# Patient Record
Sex: Female | Born: 1935 | Race: White | Hispanic: No | Smoking: Never smoker
Health system: Southern US, Community
[De-identification: ages and names within clinical notes are randomized; demographics above are authoritative.]

## PROBLEM LIST (undated history)

## (undated) DIAGNOSIS — I10 Essential (primary) hypertension: Secondary | ICD-10-CM

## (undated) DIAGNOSIS — G44009 Cluster headache syndrome, unspecified, not intractable: Secondary | ICD-10-CM

## (undated) DIAGNOSIS — K900 Celiac disease: Secondary | ICD-10-CM

## (undated) DIAGNOSIS — R0602 Shortness of breath: Secondary | ICD-10-CM

## (undated) DIAGNOSIS — I4891 Unspecified atrial fibrillation: Secondary | ICD-10-CM

## (undated) DIAGNOSIS — T4145XA Adverse effect of unspecified anesthetic, initial encounter: Secondary | ICD-10-CM

## (undated) DIAGNOSIS — G4700 Insomnia, unspecified: Secondary | ICD-10-CM

## (undated) DIAGNOSIS — T8859XA Other complications of anesthesia, initial encounter: Secondary | ICD-10-CM

## (undated) DIAGNOSIS — D649 Anemia, unspecified: Secondary | ICD-10-CM

## (undated) DIAGNOSIS — M1610 Unilateral primary osteoarthritis, unspecified hip: Secondary | ICD-10-CM

## (undated) DIAGNOSIS — I499 Cardiac arrhythmia, unspecified: Secondary | ICD-10-CM

## (undated) DIAGNOSIS — F419 Anxiety disorder, unspecified: Secondary | ICD-10-CM

## (undated) HISTORY — DX: Cluster headache syndrome, unspecified, not intractable: G44.009

## (undated) HISTORY — PX: HIP PINNING: SHX1757

## (undated) HISTORY — DX: Essential (primary) hypertension: I10

## (undated) HISTORY — PX: TONSILLECTOMY: SUR1361

## (undated) HISTORY — PX: APPENDECTOMY: SHX54

## (undated) HISTORY — PX: ABDOMINAL HYSTERECTOMY: SHX81

## (undated) HISTORY — DX: Unspecified atrial fibrillation: I48.91

## (undated) HISTORY — DX: Unilateral primary osteoarthritis, unspecified hip: M16.10

## (undated) HISTORY — DX: Insomnia, unspecified: G47.00

## (undated) HISTORY — PX: CHOLECYSTECTOMY: SHX55

## (undated) HISTORY — DX: Celiac disease: K90.0

---

## 1999-01-20 ENCOUNTER — Encounter: Payer: Self-pay | Admitting: Family Medicine

## 1999-01-20 ENCOUNTER — Ambulatory Visit (HOSPITAL_COMMUNITY): Admission: RE | Admit: 1999-01-20 | Discharge: 1999-01-20 | Payer: Self-pay | Admitting: Family Medicine

## 1999-02-20 ENCOUNTER — Ambulatory Visit (HOSPITAL_COMMUNITY): Admission: RE | Admit: 1999-02-20 | Discharge: 1999-02-20 | Payer: Self-pay | Admitting: Family Medicine

## 1999-02-20 ENCOUNTER — Encounter: Payer: Self-pay | Admitting: Family Medicine

## 1999-03-18 ENCOUNTER — Ambulatory Visit (HOSPITAL_COMMUNITY): Admission: RE | Admit: 1999-03-18 | Discharge: 1999-03-18 | Payer: Self-pay | Admitting: Family Medicine

## 1999-03-18 ENCOUNTER — Encounter: Payer: Self-pay | Admitting: Family Medicine

## 1999-03-25 ENCOUNTER — Encounter: Payer: Self-pay | Admitting: Family Medicine

## 1999-03-25 ENCOUNTER — Ambulatory Visit (HOSPITAL_COMMUNITY): Admission: RE | Admit: 1999-03-25 | Discharge: 1999-03-25 | Payer: Self-pay | Admitting: Family Medicine

## 2001-02-10 ENCOUNTER — Other Ambulatory Visit: Admission: RE | Admit: 2001-02-10 | Discharge: 2001-02-10 | Payer: Self-pay | Admitting: Family Medicine

## 2010-11-09 ENCOUNTER — Emergency Department (HOSPITAL_COMMUNITY)
Admission: EM | Admit: 2010-11-09 | Discharge: 2010-11-09 | Payer: Self-pay | Source: Home / Self Care | Admitting: Emergency Medicine

## 2011-05-31 ENCOUNTER — Inpatient Hospital Stay (HOSPITAL_COMMUNITY)
Admission: EM | Admit: 2011-05-31 | Discharge: 2011-06-06 | DRG: 309 | Disposition: A | Discharge status: Home Health | Payer: Medicare Other | Attending: Cardiology | Admitting: Cardiology

## 2011-05-31 ENCOUNTER — Emergency Department (HOSPITAL_COMMUNITY): Payer: Medicare Other

## 2011-05-31 DIAGNOSIS — K900 Celiac disease: Secondary | ICD-10-CM | POA: Diagnosis present

## 2011-05-31 DIAGNOSIS — E739 Lactose intolerance, unspecified: Secondary | ICD-10-CM | POA: Diagnosis present

## 2011-05-31 DIAGNOSIS — E871 Hypo-osmolality and hyponatremia: Secondary | ICD-10-CM | POA: Diagnosis present

## 2011-05-31 DIAGNOSIS — Z882 Allergy status to sulfonamides status: Secondary | ICD-10-CM

## 2011-05-31 DIAGNOSIS — Z7901 Long term (current) use of anticoagulants: Secondary | ICD-10-CM

## 2011-05-31 DIAGNOSIS — I4891 Unspecified atrial fibrillation: Principal | ICD-10-CM | POA: Diagnosis present

## 2011-05-31 DIAGNOSIS — I119 Hypertensive heart disease without heart failure: Secondary | ICD-10-CM | POA: Diagnosis present

## 2011-05-31 LAB — BASIC METABOLIC PANEL
BUN: 12 mg/dL (ref 6–23)
Chloride: 100 mEq/L (ref 96–112)
Glucose, Bld: 95 mg/dL (ref 70–99)
Potassium: 4 mEq/L (ref 3.5–5.1)
Sodium: 135 mEq/L (ref 135–145)

## 2011-05-31 LAB — DIFFERENTIAL
Basophils Absolute: 0 10*3/uL (ref 0.0–0.1)
Basophils Relative: 0 % (ref 0–1)
Eosinophils Absolute: 0.1 10*3/uL (ref 0.0–0.7)
Eosinophils Relative: 1 % (ref 0–5)
Lymphocytes Relative: 28 % (ref 12–46)
Lymphs Abs: 2.7 10*3/uL (ref 0.7–4.0)
Monocytes Absolute: 1 10*3/uL (ref 0.1–1.0)
Monocytes Relative: 10 % (ref 3–12)
Neutro Abs: 5.8 10*3/uL (ref 1.7–7.7)
Neutrophils Relative %: 61 % (ref 43–77)

## 2011-05-31 LAB — CBC
HCT: 37.2 % (ref 36.0–46.0)
Hemoglobin: 12.9 g/dL (ref 12.0–15.0)
MCH: 30 pg (ref 26.0–34.0)
MCHC: 34.7 g/dL (ref 30.0–36.0)
MCV: 86.5 fL (ref 78.0–100.0)
Platelets: 264 10*3/uL (ref 150–400)
RBC: 4.3 MIL/uL (ref 3.87–5.11)
RDW: 12.6 % (ref 11.5–15.5)
WBC: 9.7 10*3/uL (ref 4.0–10.5)

## 2011-06-01 LAB — CBC
HCT: 34.5 % — ABNORMAL LOW (ref 36.0–46.0)
Hemoglobin: 11.7 g/dL — ABNORMAL LOW (ref 12.0–15.0)
MCHC: 33.9 g/dL (ref 30.0–36.0)
RDW: 12.7 % (ref 11.5–15.5)
WBC: 7.2 10*3/uL (ref 4.0–10.5)

## 2011-06-01 LAB — PRO B NATRIURETIC PEPTIDE: Pro B Natriuretic peptide (BNP): 684.4 pg/mL — ABNORMAL HIGH (ref 0–450)

## 2011-06-01 LAB — HEPARIN LEVEL (UNFRACTIONATED)
Heparin Unfractionated: <0.1 IU/mL — ABNORMAL LOW (ref 0.30–0.70)
Heparin Unfractionated: 0.98 IU/mL — ABNORMAL HIGH (ref 0.30–0.70)

## 2011-06-01 LAB — MRSA PCR SCREENING: MRSA by PCR: NEGATIVE

## 2011-06-01 LAB — D-DIMER, QUANTITATIVE: D-Dimer, Quant: 0.42 ug/mL-FEU (ref 0.00–0.48)

## 2011-06-01 LAB — TROPONIN I: Troponin I: <0.3 ng/mL (ref <0.30)

## 2011-06-02 LAB — CBC
Hemoglobin: 12.6 g/dL (ref 12.0–15.0)
MCH: 30.3 pg (ref 26.0–34.0)
MCHC: 35.2 g/dL (ref 30.0–36.0)
MCV: 86.1 fL (ref 78.0–100.0)
RBC: 4.16 MIL/uL (ref 3.87–5.11)

## 2011-06-02 LAB — HEPARIN LEVEL (UNFRACTIONATED): Heparin Unfractionated: 0.21 IU/mL — ABNORMAL LOW (ref 0.30–0.70)

## 2011-06-03 LAB — BASIC METABOLIC PANEL
BUN: 8 mg/dL (ref 6–23)
CO2: 32 mEq/L (ref 19–32)
Calcium: 8.7 mg/dL (ref 8.4–10.5)
Chloride: 91 mEq/L — ABNORMAL LOW (ref 96–112)
Creatinine, Ser: <0.47 mg/dL — ABNORMAL LOW (ref 0.50–1.10)
Glucose, Bld: 109 mg/dL — ABNORMAL HIGH (ref 70–99)
Potassium: 3.6 mEq/L (ref 3.5–5.1)
Sodium: 130 mEq/L — ABNORMAL LOW (ref 135–145)

## 2011-06-03 LAB — PROTIME-INR
INR: 1.39 (ref 0.00–1.49)
Prothrombin Time: 17.3 seconds — ABNORMAL HIGH (ref 11.6–15.2)

## 2011-06-03 LAB — CBC
HCT: 35.2 % — ABNORMAL LOW (ref 36.0–46.0)
Hemoglobin: 12.5 g/dL (ref 12.0–15.0)
MCV: 83.6 fL (ref 78.0–100.0)
WBC: 9.4 10*3/uL (ref 4.0–10.5)

## 2011-06-03 LAB — HEPARIN LEVEL (UNFRACTIONATED): Heparin Unfractionated: 0.31 IU/mL (ref 0.30–0.70)

## 2011-06-04 LAB — BASIC METABOLIC PANEL
Chloride: 96 mEq/L (ref 96–112)
Creatinine, Ser: <0.47 mg/dL — ABNORMAL LOW (ref 0.50–1.10)
Sodium: 133 mEq/L — ABNORMAL LOW (ref 135–145)

## 2011-06-04 LAB — PROTIME-INR
INR: 1.9 — ABNORMAL HIGH (ref 0.00–1.49)
Prothrombin Time: 22.1 seconds — ABNORMAL HIGH (ref 11.6–15.2)

## 2011-06-04 LAB — CBC
HCT: 36.8 % (ref 36.0–46.0)
Hemoglobin: 13.1 g/dL (ref 12.0–15.0)
RBC: 4.39 MIL/uL (ref 3.87–5.11)
RDW: 12.3 % (ref 11.5–15.5)
WBC: 7.8 10*3/uL (ref 4.0–10.5)

## 2011-06-05 LAB — CBC
HCT: 37 % (ref 36.0–46.0)
Platelets: 288 10*3/uL (ref 150–400)
RBC: 4.34 MIL/uL (ref 3.87–5.11)
RDW: 12.7 % (ref 11.5–15.5)
WBC: 8.7 10*3/uL (ref 4.0–10.5)

## 2011-06-05 LAB — HEPARIN LEVEL (UNFRACTIONATED): Heparin Unfractionated: 0.37 IU/mL (ref 0.30–0.70)

## 2011-06-05 LAB — PROTIME-INR: INR: 2.03 — ABNORMAL HIGH (ref 0.00–1.49)

## 2011-06-06 LAB — PROTIME-INR: Prothrombin Time: 29.8 seconds — ABNORMAL HIGH (ref 11.6–15.2)

## 2011-06-15 NOTE — H&P (Signed)
[name], [name] NO.:  [id number]  MEDICAL RECORD NO.:  [id number]  LOCATION:  [facility]                         FACILITY:  [facility]  PHYSICIAN:  Georga Hacking, M.D.DATE OF BIRTH:  [birth year]  DATE OF ADMISSION:  05/31/2011                             HISTORY & PHYSICAL   REASON FOR ADMISSION:  Shortness of breath and atrial fibrillation.  HISTORY:  The patient is a 77 year old female who has previously been in excellent health except for hypertension.  She has developed worsening hypertension following a cataract extraction and eventually saw Dr. Donato Schultz who evidently released her several months ago from his care stating that her blood pressure was better.  She does state a several months history of fatigue, some shortness of breath, and diminished energy as well as difficulty doing her normal activities.  She evidently developed hyponatremia and was taken off potentially either Lasix or lisinopril although she is unclear which one.  She eventually was tapered off trazodone recently.  She had developed worsening shortness of breath and dyspnea and went and spoke with the insurance nurse hotline. Her insurance company called her primary care doctor's office who advised to come to the emergency room where she was found to be in rapid atrial fibrillation.  She has noted some episodic atrial fibrillation and some episodic palpitations in the past.  She has complained of some vague pressure in her chest.  She denies any previous history of arrhythmias, although there is an EKG from 1999 in the medical record system that showed this rapid atrial fibrillation.  She has never been on anticoagulation.  She was given intravenous diltiazem 5 mg IV and is admitted to the hospital for further evaluation.  PAST MEDICAL HISTORY:  Remarkable for hypertension.  There is no history of diabetes or hyperlipidemia.  There is no history of GI bleeding.  She does have a  history of celiac disease and is also lactose intolerant. There is no history of stroke or TIAs.  PAST SURGICAL HISTORY: 1. Cholecystectomy. 2. Tonsillectomy. 3. Appendectomy. 4. Left hip fracture. 5. Hysterectomy.  ALLERGIES:  CODEINE.  CURRENT MEDICATIONS: 1. Metoprolol 25 mg daily. 2. Clonidine 0.05 mg t.i.d.  FAMILY HISTORY:  Father died at age 51 of old age.  Mother died age 31 of old age.  She is the sole survivor of her family.  She had seven brothers and sisters.  One sister died at age 11 of heart disease.  All the other brothers and sisters died in their 38s and 25s.  She has seven children, one child died of liver cancer, two died accidently, the others are alive and well.  SOCIAL HISTORY:  She has been a widow 3 times, her previous husband died several years ago.  She is a nonsmoker.  She does not use alcohol to excess.  She still lives independently and still drives and maintains her own home.  REVIEW OF SYSTEMS:  Weight has been stable.  No skin problems.  No eye, ear, nose, or throat problems.  She has previous cataract extraction. She has noted worsening dyspnea recently over the past 2-3 months but previous to that was able to do normal activities of  daily living.  No history of asthma.  No cough.  She complains of occasional hemorrhoids. No previous GI bleeding or ulcers normally.  Normally GI problems as long she minds her diet.  She has a history of a bladder prolapse. Otherwise, no GU symptoms or problems.  Has a previous hip surgery and mild arthritis there.  No history of stroke or TIA.  Other than as noted above, the remainder of the review of systems is unremarkable.  PHYSICAL EXAMINATION:  GENERAL:  She is a pleasant elderly woman who appears markedly younger than her stated age. VITAL SIGNS:  Blood pressure is currently 180/90, pulse is currently 110 and irregularly irregular. SKIN:  Warm and dry.  No obvious signs of trauma, mass, or  lesions. ENT:  EOMI.  PERRLA.  CNS clear.  Fundi not examined.  Pharynx is negative. NECK:  Supple without masses, JVD, thyromegaly, or bruits. LUNGS:  There is an increased AP diameter and some dorsal kyphosis noted.  Breath sounds were normal. CARDIOVASCULAR:  Rapid irregular rhythm.  There is no S3 and no murmurs appreciated. ABDOMEN:  Soft and nontender.  There is aneurysm palpated.  No hepatosplenomegaly is noted. EXTREMITIES:  Femoral pulses are 2+ without bruits.  Peripheral pulses show 3+ posterior tibialis and 1+ dorsalis pedis.  There is no edema noted. NEUROLOGIC:  Normal cranial nerves.  Sensory and motor are intact.  IMAGING:  EKG shows atrial fibrillation with rapid ventricular response, right axis deviation.  Chest x-ray shows cardiomegaly with small bilateral pleural effusions.  BMP is normal.  CBC is normal.  IMPRESSION: 1. Atrial fibrillation, paroxysmal by records, but new onset by the     patient's history, probably subacute by history. 2. Hypertensive heart disease, blood pressure not well-controlled. 3. Recent history of hyponatremia. 4. History of celiac disease. 5. Cardiomegaly.  RECOMMENDATIONS:  The patient will be admitted to Step-Down Unit and placed on IV heparin.  She has a CHADS score of 2 and likely will need anticoagulation with warfarin.  Obtain echocardiogram, TSH, and further workup per Dr. Anne Fu.     Georga Hacking, M.D.     WST/MEDQ  D:  05/31/2011  T:  05/31/2011  Job:  [id number]  cc:   [name], M.D. [name], MD  Electronically Signed by Lacretia Nicks. Donnie Aho M.D. on 06/15/2011 05:12:48 PM

## 2011-06-15 NOTE — Discharge Summary (Signed)
[name], COPPOCK NO.:  [id number]  MEDICAL RECORD NO.:  [id number]  LOCATION:  2001                         FACILITY:  [facility]  PHYSICIAN:  Georga Hacking, M.D.DATE OF BIRTH:  1917/06/21  DATE OF ADMISSION:  05/31/2011 DATE OF DISCHARGE:  06/05/2011                              DISCHARGE SUMMARY   FINAL DIAGNOSES: 1. Atrial fibrillation with rapid ventricular response, now     controlled. 2. Hypertensive heart disease. 3. History of celiac disease and lactose intolerance.  PROCEDURES:  Echocardiogram.  HISTORY:  This is a 77 year old female previous in excellent health except for hypertension.  She developed worsening hypertension following a cataract extraction, saw Dr. Donato Schultz who released her from followup several years ago, stating her blood pressure was better.  She noted a several-month history of fatigue, shortness of breath, and diminished energy and difficulty doing her normal activities.  She was tapered off trazodone recently.  She developed worsening shortness of breath and dyspnea.  Spoke to [employer] nurse, who advised her to go to the emergency room where she was found to be in rapid atrial fibrillation.  She has had episodic atrial fibrillation and palpitations in the past and there is an old EKG in 1999 showing rapid atrial fibrillation.  She has never been on warfarin anticoagulation.  She was admitted to the hospital for further evaluation.  Please see the previously dictated history and physical for remainder of the details.  HOSPITAL COURSE:  She was placed on a diltiazem drip and a heparin drip. She had some mild crackles at the bases.  Laboratory data on admission showed normal CBC.  Chem-7 was normal.  BUN was 12, creatinine was 0.47. Troponin was negative on admission.  TSH was 1.189.  She was placed on heparin and tolerated this well.  She was placed on warfarin therapy. Her atrial fibrillation came under better  control and she had a higher dose of metoprolol given.  She continued to have rapid atrial fibrillation with activity and was placed on diltiazem 120 mg twice daily that improved.  Her warfarin became therapeutic and she was able to be weaned off heparin and accordingly was discharged.  She was given extensive discharge education regarding warfarin and her atrial fibrillation rate was controlled.  Her blood pressure was still mildly elevated on discharge.  She may be a candidate for spironolactone at the time of discharge.  She is discharged at this time on clonidine 0.1 mg b.i.d., diltiazem sustained release 120 mg b.i.d., losartan 50 mg daily, metoprolol succinate 100 mg daily, warfarin she is to take 2 mg tomorrow.  Her INR is 2.78 on discharge and she is to follow up with [facility] on Tuesday for further instructions.  Calcium carbonate 1200 mg daily, iron 325 mg daily, vitamin B12 daily, and vitamin D3 daily.  She is to have home health R.N. and home health PT.  She is to follow up with Dr. Anne Fu in 1 week and is to report shortness of breath or syncope.  She is to have a warfarin anticoagulation followup on Tuesday.  Greater than 45 minutes spent in discharge preparation.     Georga Hacking,  M.D.     WST/MEDQ  D:  06/06/2011  T:  06/06/2011  Job:  [id number]  cc:   [name], MD [name], M.D.  Electronically Signed by Lacretia Nicks. Donnie Aho M.D. on 06/15/2011 05:12:54 PM

## 2011-09-20 ENCOUNTER — Emergency Department (HOSPITAL_COMMUNITY): Payer: Medicare Other

## 2011-09-20 ENCOUNTER — Inpatient Hospital Stay (HOSPITAL_COMMUNITY)
Admission: EM | Admit: 2011-09-20 | Discharge: 2011-09-23 | DRG: 291 | Disposition: A | Discharge status: Home Health | Payer: Medicare Other | Attending: Internal Medicine | Admitting: Internal Medicine

## 2011-09-20 DIAGNOSIS — I10 Essential (primary) hypertension: Secondary | ICD-10-CM | POA: Diagnosis present

## 2011-09-20 DIAGNOSIS — I4891 Unspecified atrial fibrillation: Secondary | ICD-10-CM | POA: Diagnosis present

## 2011-09-20 DIAGNOSIS — Z8719 Personal history of other diseases of the digestive system: Secondary | ICD-10-CM

## 2011-09-20 DIAGNOSIS — I509 Heart failure, unspecified: Secondary | ICD-10-CM | POA: Diagnosis present

## 2011-09-20 DIAGNOSIS — I5031 Acute diastolic (congestive) heart failure: Principal | ICD-10-CM | POA: Diagnosis present

## 2011-09-20 DIAGNOSIS — Z886 Allergy status to analgesic agent status: Secondary | ICD-10-CM

## 2011-09-20 DIAGNOSIS — Z79899 Other long term (current) drug therapy: Secondary | ICD-10-CM

## 2011-09-20 DIAGNOSIS — Z7901 Long term (current) use of anticoagulants: Secondary | ICD-10-CM

## 2011-09-20 DIAGNOSIS — J189 Pneumonia, unspecified organism: Secondary | ICD-10-CM | POA: Diagnosis present

## 2011-09-20 DIAGNOSIS — Z882 Allergy status to sulfonamides status: Secondary | ICD-10-CM

## 2011-09-20 LAB — CBC
HCT: 40.1 % (ref 36.0–46.0)
Hemoglobin: 13.4 g/dL (ref 12.0–15.0)
MCH: 28.7 pg (ref 26.0–34.0)
MCHC: 33.4 g/dL (ref 30.0–36.0)
MCV: 85.9 fL (ref 78.0–100.0)

## 2011-09-20 LAB — CK TOTAL AND CKMB (NOT AT ARMC)
CK, MB: 2.8 ng/mL (ref 0.3–4.0)
Relative Index: INVALID (ref 0.0–2.5)

## 2011-09-20 LAB — POCT I-STAT, CHEM 8
BUN: 3 mg/dL — ABNORMAL LOW (ref 6–23)
Calcium, Ion: 1.14 mmol/L (ref 1.12–1.32)
Chloride: 101 mEq/L (ref 96–112)
Creatinine, Ser: 0.4 mg/dL — ABNORMAL LOW (ref 0.50–1.10)
Glucose, Bld: 122 mg/dL — ABNORMAL HIGH (ref 70–99)
HCT: 42 % (ref 36.0–46.0)

## 2011-09-20 LAB — DIFFERENTIAL
Lymphocytes Relative: 17 % (ref 12–46)
Lymphs Abs: 1.5 10*3/uL (ref 0.7–4.0)
Monocytes Absolute: 0.6 10*3/uL (ref 0.1–1.0)
Monocytes Relative: 7 % (ref 3–12)
Neutro Abs: 6.6 10*3/uL (ref 1.7–7.7)

## 2011-09-20 LAB — D-DIMER, QUANTITATIVE: D-Dimer, Quant: 0.35 ug/mL-FEU (ref 0.00–0.48)

## 2011-09-20 LAB — URINALYSIS, ROUTINE W REFLEX MICROSCOPIC
Glucose, UA: NEGATIVE mg/dL
Hgb urine dipstick: NEGATIVE
Ketones, ur: NEGATIVE mg/dL
Protein, ur: 30 mg/dL — AB
pH: 7.5 (ref 5.0–8.0)

## 2011-09-20 LAB — CARDIAC PANEL(CRET KIN+CKTOT+MB+TROPI)
CK, MB: 2.9 ng/mL (ref 0.3–4.0)
Relative Index: INVALID (ref 0.0–2.5)
Total CK: 65 U/L (ref 7–177)
Troponin I: <0.3 ng/mL (ref <0.30)
Troponin I: <0.3 ng/mL (ref <0.30)

## 2011-09-20 LAB — URINE MICROSCOPIC-ADD ON

## 2011-09-21 ENCOUNTER — Inpatient Hospital Stay (HOSPITAL_COMMUNITY): Payer: Medicare Other

## 2011-09-21 LAB — COMPREHENSIVE METABOLIC PANEL
ALT: 27 U/L (ref 0–35)
AST: 24 U/L (ref 0–37)
Albumin: 3.2 g/dL — ABNORMAL LOW (ref 3.5–5.2)
Alkaline Phosphatase: 100 U/L (ref 39–117)
CO2: 30 mEq/L (ref 19–32)
Chloride: 102 mEq/L (ref 96–112)
Creatinine, Ser: 0.47 mg/dL — ABNORMAL LOW (ref 0.50–1.10)
GFR calc non Af Amer: 84 mL/min — ABNORMAL LOW (ref >90)
Potassium: 4.2 mEq/L (ref 3.5–5.1)
Total Bilirubin: 0.2 mg/dL — ABNORMAL LOW (ref 0.3–1.2)

## 2011-09-21 LAB — PROTIME-INR: INR: 2.71 — ABNORMAL HIGH (ref 0.00–1.49)

## 2011-09-21 LAB — CARDIAC PANEL(CRET KIN+CKTOT+MB+TROPI): Relative Index: INVALID (ref 0.0–2.5)

## 2011-09-22 LAB — BASIC METABOLIC PANEL
BUN: 9 mg/dL (ref 6–23)
CO2: 29 mEq/L (ref 19–32)
Chloride: 99 mEq/L (ref 96–112)
Glucose, Bld: 89 mg/dL (ref 70–99)
Potassium: 4.1 mEq/L (ref 3.5–5.1)
Sodium: 138 mEq/L (ref 135–145)

## 2011-09-23 LAB — PRO B NATRIURETIC PEPTIDE: Pro B Natriuretic peptide (BNP): 640.1 pg/mL — ABNORMAL HIGH (ref 0–450)

## 2011-09-23 LAB — BASIC METABOLIC PANEL
BUN: 8 mg/dL (ref 6–23)
GFR calc Af Amer: >90 mL/min (ref >90)
GFR calc non Af Amer: 84 mL/min — ABNORMAL LOW (ref >90)
Potassium: 3.8 mEq/L (ref 3.5–5.1)
Sodium: 140 mEq/L (ref 135–145)

## 2011-09-23 LAB — CBC
HCT: 36 % (ref 36.0–46.0)
Hemoglobin: 12.2 g/dL (ref 12.0–15.0)
MCHC: 33.9 g/dL (ref 30.0–36.0)
RBC: 4.23 MIL/uL (ref 3.87–5.11)
WBC: 6.7 10*3/uL (ref 4.0–10.5)

## 2011-09-23 LAB — PROTIME-INR
INR: 2.76 — ABNORMAL HIGH (ref 0.00–1.49)
Prothrombin Time: 29.6 seconds — ABNORMAL HIGH (ref 11.6–15.2)

## 2011-09-23 NOTE — Discharge Summary (Signed)
[name], MENNELLA NO.:  [id number]  MEDICAL RECORD NO.:  [id number]  LOCATION:  4734                         FACILITY:  [facility]  PHYSICIAN:  Isidor Holts, M.D.  DATE OF BIRTH:  Nov 22, 1917  DATE OF ADMISSION:  09/20/2011 DATE OF DISCHARGE:                              DISCHARGE SUMMARY   PRIMARY MD:  Marissa Joe, MD.  PRIMARY CARDIOLOGIST:  Mark C. Anne Fu, MD  DISCHARGE DIAGNOSES: 1. Community-acquired pneumonia. 2. Acute decompensation of diastolic congestive heart failure     (ejection fraction 65% to 70%). 3. Dyspnea, multifactorial secondary to community-acquired pneumonia     and acute decompensation of diastolic congestive heart failure. 4. Chronic atrial fibrillation on anticoagulation. 5. Hypertension.  DISCHARGE MEDICATIONS: 1. Furosemide 40 mg p.o. daily. 2. Avelox 400 mg p.o. daily for 7 days. 3. Metoprolol succinate XL 150 mg p.o. daily(was on 100 mg p.o.     daily). 4. Calcium carbonate/vitamin-D (1200 mg/1000 international units) 1     tablet p.o. daily. 5. Clonidine 0.1 mg p.o. b.i.d. 6. Diltiazem CD 120 mg p.o. b.i.d. 7. Iron 325 mg p.o. daily. 8. Losartan 50 mg p.o. daily. 9. Multivitamin therapeutic 1 tablet p.o. daily. 10.Vitamin B12 1000 mcg p.o. daily. 11.Coumadin per INR, currently on alternating the regimen, i.e., 1 mg     on one day and 2 mg the next ETC.  PROCEDURES: 1. Chest x-ray, September 20, 2011.  This showed small pleural effusion     with associated consolidations, atelectasis versus pneumonia.     Biapical opacities may represent pleural parenchymal scarring,     appears more prominent; however, this may be exaggerated by     portable technique. 2. Chest x-ray, September 21, 2011.  This showed improvement in bilateral     airspace disease in the upper and lower lobes.  This may be due to     resolving pneumonia or edema.  Small pleural effusions remain.  CONSULTATIONS:  Marissa Bathe, MD,  cardiologist.  ADMISSION HISTORY:  As in H and P notes of September 20, 2011, dictated by Dr. Donnalee Buck. However, in brief, this is a 77 year old female with known history of chronic atrial fibrillation on anticoagulation, hypertension, celiac disease,  lactose intolerance, status post cholecystectomy, status post tonsillectomy, status post appendectomy, status post previous left hip fracture and status post hysterectomy, presenting with progressive shortness of breath associated with pleuritic-type chest pain, 3 pillow orthopnea, paroxysmal nocturnal dyspnea and lower extremity swelling for the past 2 weeks.  She was admitted for further evaluation, investigation, and management.  CLINICAL COURSE: 1. Congestive heart failure.  The patient presented as described     above.  Chest x-ray showed findings suggestive of congestive heart     failure, although pneumonia could not be excluded.  Her BNP was     elevated at 1302. She was managed with intravenous Lasix.     Cardiology consultation was kindly provided by Dr. Donato Schultz who     was very helpful in adjusting the patient's medications and     supervising heart failure management.  Cardiac enzymes were cycled,     remained un-elevated.  The patient already has had a  2D     echocardiogram done in June 2012 during her last hospitalization     and this demonstrated an ejection fraction of 65%-70% consistent     with acute decompensation of diastolic congestive heart failure.     As of September 23, 2011, the patient was no longer short of breath     at rest or on exertion, was ambulating and felt considerably     better.  2. Community-acquired pneumonia.  This was demonstrated on imaging     studies done at the time of presentation, although the patient was     afebrile and did not have an elevated white cell count.  She was     managed initially with a combination of Rocephin and azithromycin     and by September 22, 2011, was feeling  considerably better.  She was     transitioned to oral Avelox on September 23, 2011 to complete a     further 7 days of antibiotic treatment.  3. Chronic atrial fibrillation.  The patient's heart rate was ranging     in the 100s to 110 at the time of presentation.  Dr. Anne Fu     recommended increasing the patient's dose of metoprolol.  This was     done accordingly, with adequate rate control.  4. Chronic anticoagulation.  This was supervised by clinical     pharmacologist and the patient's INR remained therapeutic     throughout hospitalization.  5. Hypertension.  This was controlled with a combination of clonidine,     Cardizem CD, metoprolol, and losartan.  DISPOSITION:  The patient as of September 23, 2011, was feeling considerably better.  There were no new issues.  She was considered clinically stable for discharge.  Her dyspnea was obviously multifactorial secondary to a combination of pneumonia and decompensation of congestive heart failure and this has resolved.  She was evaluated by PT/OT during the course of this hospitalization and recommended home health PT as well as 24-hour supervision, initially. She does have a supportive family.  The patient was discharged on September 23, 2011.  ACTIVITY:  As tolerated.  Recommended to increase activity slowly, otherwise per PT.  DIET:  Heart healthy.  FOLLOWUP INSTRUCTIONS:  The patient will follow up with her primary MD, Dr. Tally Buck in the coming week.  She is instructed to call for an appointment.  In addition, she is to follow up with Dr. Serena Buck, her primary cardiologist in 2 weeks.  She has been instructed to call for an appointment.  The patient has been instructed to present at Dr. Anne Fu' office in the a.m. of Monday September 27, 2011 for PT/INR check.  Timing of further INR checks as well as modification of Coumadin dosage if indicated, will thereafter be as directed by her primary cardiologist.  All this has  been communicated to the patient and family.  They verbalized understanding.     Isidor Holts, M.D.     CO/MEDQ  D:  09/23/2011  T:  09/23/2011  Job:  [id number]  cc:   [name], M.D. Marissa Bathe, MD  Electronically Signed by Isidor Holts M.D. on 09/23/2011 06:30:43 PM

## 2011-09-23 NOTE — Consult Note (Signed)
NAMEJESSLY, LEBECK NO.:  [id number]  MEDICAL RECORD NO.:  [id number]  LOCATION:  4734                         FACILITY:  [facility]  PHYSICIAN:  [name], MD      DATE OF BIRTH:  [birth year]  DATE OF CONSULTATION:  09/20/2011 DATE OF DISCHARGE:                                CONSULTATION   PRIMARY PHYSICIAN:  [name], MD  CARDIOLOGIST:  [name], MD  REASON FOR CONSULTATION:  Evaluation of dyspnea.  HISTORY OF PRESENT ILLNESS:  Ms. Utt is a 77 year old female with normal ejection fraction, hypertension, chronic atrial fibrillation on chronic anticoagulation with hypertension who was in her usual state until earlier this morning where she "did not think she was going to make it" due to excessive dyspnea on exertion.  She promptly came over to the [facility] Emergency Room for further evaluation where her EKG via EMS demonstrated atrial fibrillation with heart rates in the 115 range and currently in the 100 range on telemetry.  Her blood pressure upon arrival was 164/73 and she was saturating 100% on 2 L.  She did not complain of any chest discomfort, but she was acutely short of breath. No recent fevers, although she did state that she was chilled yesterday when the weather changed temperature.  She also denies any recent cough. She was admitted via the [facility].  Currently, she is just finishing up lunch and appeared to be quite comfortable in bed, although while we were talking she did increase her rate of breathing. She underwent a chest x-ray which demonstrated small pleural effusions with consolidations, atelectasis versus pneumonia.  She was placed empirically on antibiotics for this.  PAST MEDICAL HISTORY: 1. Atrial fibrillation, persistent, on chronic Coumadin. 2. Hypertensive heart disease - diastolic dysfunction. 3. History of celiac disease, lactose intolerance. 4. Hyponatremia. 5. Insomnia.  MEDICATIONS:  At home,  she is taking: 1. Losartan 50 mg once a day. 2. Diltiazem ER 120 mg twice a day. 3. Metoprolol succinate 100 mg a day. 4. Clonidine 0.1 mg twice a day. 5. Warfarin 2 mg/1 mg every other day. 6. Iron tablets. 7. Calcium tablets. 8. Vitamin B12.  SOCIAL HISTORY:  She is nonsmoker, no alcohol, no drug use.  ALLERGIES: 1. CODEINE. 2. SULFA DRUGS. 3. ASPIRIN makes her feel like she is floating.  FAMILY HISTORY:  Father had a myocardial infarction at age of 63.  Her daughter has liver cancer.  REVIEW OF SYSTEMS:  Unless explained above, all other 12 review of systems negative.  She does admit to having some pleuritic-type chest discomfort underneath her ribcage bilaterally radiating to her back when she takes in a deep breath that is mild in intensity.  PHYSICAL EXAM:  VITAL SIGNS:  Blood pressure originally 160/90, currently 142/78.  Pulse in the 80s to 110 range.  She is currently 100- 110, in atrial fibrillation.  Saturating 99% on 2 L. GENERAL:  Alert and oriented x3, in no acute distress, looks much younger than stated age, pleasant, mildly anxious. NECK:  Supple.  No lymphadenopathy.  No thyromegaly.  No carotid bruits. No JVD. CARDIOVASCULAR:  Irregularly irregular rhythm with a soft systolic murmur  heard at right upper sternal border. LUNGS:  Clear to auscultation bilaterally.  Normal respiratory effort. There are mild crackles heard at bases bilaterally.  No wheezes. ABDOMEN:  Soft, nontender, normoactive bowel sounds.  No rebound or guarding. EXTREMITIES:  No clubbing, cyanosis, or edema.  Palpable distal pulses. GU:  Deferred. RECTAL:  Deferred. SKIN:  Warm, dry, and intact. HEENT:  Normocephalic atraumatic.  Moist mucous membranes.  DATA:  Chest x-ray personally reviewed as described above.  Bilateral opacities in bases.  Recent echocardiogram from June 2012 shows normal EF, mitral annular calcification, mild MR, mild to moderate TR with pulmonary pressures  estimated at 39 mmHg.  Cardiac markers are currently normal.  Creatinine 0.4.  Hemoglobin 14.  INR currently pending.  Prior medical records from outpatient setting reviewed as well.  ASSESSMENT/PLAN:  This is a 77 year old female with persistent atrial fibrillation, diastolic dysfunction, hypertension, and chronic anticoagulation who presented with acute dyspnea. 1. Acute dyspnea - possible combination of etiologies include     bilateral pneumonia based on chest x-ray and possibly a component     of acute diastolic heart failure in the setting of     hypertension/atrial fibrillation.  Her atrial fibrillation     currently is fairly well rate controlled with heart rates of     approximately 100.  She is on Toprol-XL 100 mg a day as well as     diltiazem 120 mg twice a day.  I would like to increase her Toprol-     XL from 100-150 mg a day to see if this will help better, rate     control her.  I do not believe that she needs a repeat     echocardiogram.  It is reasonable to check a BNP.  Currently, she     is feeling much better.  I am not sure whether her acute dyspneic     episode might have been triggered by hypertension as well.     Continue to monitor her closely.  Rule out any life-threatening     etiologies.  Current cardiac markers are negative.  Current sodium     is     139 with a prior history of hyponatremia.  Coumadin is therapeutic     at 2.23. 2. Hypertension - continue current outpatient strategy including ARB. 3. Pneumonia - per Primary Team.  We will follow along with you.     [name], MD     MCS/MEDQ  D:  09/20/2011  T:  09/21/2011  Job:  [id number]  Electronically Signed by Donato Schultz MD on 09/23/2011 06:20:38 AM

## 2011-09-24 NOTE — H&P (Signed)
[name], [name]               ACCOUNT NO.:  [id number]  MEDICAL RECORD NO.:  [id number]  LOCATION:  4734                         FACILITY:  [facility]  PHYSICIAN:  Kela Millin, M.D.DATE OF BIRTH:  05-Jan-1917  DATE OF ADMISSION:  09/20/2011 DATE OF DISCHARGE:                             HISTORY & PHYSICAL   PRIMARY CARE PHYSICIAN:  [name], MD  PRIMARY CARDIOLOGIST:  [name], MD  CHIEF COMPLAINT:  Worsening shortness of breath and pleuritic chest pain.  HISTORY OF PRESENT ILLNESS:  The patient is a 77 year old white female with past medical history significant for atrial fibrillation, hypertensive heart disease, and history of celiac disease and lactose intolerance who presents with the above complaints.  She states that since she was discharged from the hospital in June of this year following hospitalization for AFib with rapid ventricular response, she has been short of breath and it has just progressively worsened to where she was short of breath last night just with talking and with very minimal exertion.  She admits to a 3 pillow orthopnea.  She also states that she has had some PND and leg swelling for the past couple of weeks greater in her right lower extremity.  She was seen in the ED and a set of cardiac enzymes were negative.  A chest x-ray was done which showed small pleural effusions with associated consolidations/atelectasis versus pneumonia.  It was noted that she does have some biapical opacities which may represent pleural parenchymal scarring.  At the time of my exam, she states that her shortness of breath has improved after she got a breathing treatment in the ED.  She admits to pleuritic chest pain - tightness in her chest and in her back only with breathing.  She denies cough, hemoptysis, dysuria, fevers, diarrhea, melena, and no hematochezia.  She states that she has had some urinary frequency but again denies dysuria.  She is  admitted for further evaluation and management.  PAST MEDICAL HISTORY:  As above.  MEDICATIONS: 1. Clonidine 0.1 mg p.o. b.i.d. 2. Losartan 50 mg p.o. daily. 3. Multivitamins 1 p.o. daily. 4. Calcium carbonate/vitamin D 1200 mg/1000 units p.o. daily. 5. Vitamin B12 1000 mcg p.o. daily. 6. Iron 325 mg p.o. daily. 7. Coumadin 2 mg 0.5-1 tablet at bedtime. 8. Diltiazem 120 mg 1 tablet b.i.d. 9. Metoprolol 100 mg XL p.o. daily.  ALLERGIES:  CODEINE and SULFA.  SOCIAL HISTORY:  Denies tobacco, also denies alcohol.  FAMILY HISTORY:  Her dad is deceased, had an MI at age 28.  Her daughter had liver cancer.  REVIEW OF SYSTEMS:  As per HPI, other review of systems negative.  PHYSICAL EXAMINATION:  GENERAL:  The patient is an elderly white female, in no apparent distress with nasal cannula oxygen on. VITAL SIGNS:  Her temperature is 97.9 with a blood pressure of 142/78, pulse of 88, respiratory rate of 20.  O2 sat 100% on 1.5 liters of oxygen. HEENT:  PERRL, EOMI, sclerae anicteric, moist mucous membranes.  No oral exudates. NECK:  Supple, no adenopathy, no thyromegaly.  No JVD and no carotid bruits appreciated. LUNGS:  Decreased breath sounds at the bases bilaterally, moderate air movement,  no crackles and no wheezes appreciated. CARDIOVASCULAR:  Irregularly irregular, normal S1, S2, rate controlled. No S3 appreciated . ABDOMEN:  Soft, bowel sounds present, nontender, nondistended no organomegaly, no masses palpable. EXTREMITIES:  She has trace to 1+ edema in her left lower extremity and no edema on the right lower extremity.  No cyanosis. NEURO:  She is alert and oriented x3.  Cranial nerves II through XII grossly intact.  Nonfocal exam.  LABORATORY DATA:  Chest x-ray as per HPI.  Also, her white cell count is 8.8 with a hemoglobin of 13.4, hematocrit of 40.1, platelet count of 271,000.  INR is 2.23.  Point-of-care markers negative x1.  Sodium is 139 with a potassium of 3.8,  chloride 101, glucose 122, BUN of 3, creatinine 0.40, and ionized calcium is 1.14.  ASSESSMENT AND PLAN: 1. Shortness of breath/dyspnea - pneumonia versus congestive heart     failure.  We will place on empiric antibiotics, obtain a brain     natriuretic peptide.  Follow and further treat as appropriate     pending studies.  We will obtain followup PA and lateral chest x-     rays in the a.m. We will also cycle cardiac enzymes and follow. 2. Atrial fibrillation - rate is controlled.  We will continue     outpatient medications.  The patient states that she has had     dyspnea since she was discharged in June, so we will consult     Cardiology for further recommendations, cycling enzymes as above.     We will continue Coumadin with INR monitoring. 3. Hypertension - continue outpatient medications.     Kela Millin, M.D.     ACV/MEDQ  D:  09/20/2011  T:  09/20/2011  Job:  [id number]  cc:   [name], M.D. [name], MD  Electronically Signed by Donnalee Curry M.D. on 09/24/2011 09:29:50 PM

## 2012-02-03 ENCOUNTER — Ambulatory Visit
Admission: RE | Admit: 2012-02-03 | Discharge: 2012-02-03 | Disposition: A | Discharge status: Home / Self Care | Payer: PRIVATE HEALTH INSURANCE | Source: Ambulatory Visit | Attending: Family Medicine | Admitting: Family Medicine

## 2012-02-03 ENCOUNTER — Other Ambulatory Visit: Payer: Self-pay | Admitting: Family Medicine

## 2012-02-03 DIAGNOSIS — M545 Low back pain: Secondary | ICD-10-CM

## 2012-02-03 DIAGNOSIS — M25559 Pain in unspecified hip: Secondary | ICD-10-CM

## 2012-07-09 ENCOUNTER — Other Ambulatory Visit: Payer: Self-pay | Admitting: Cardiology

## 2013-09-13 ENCOUNTER — Ambulatory Visit (INDEPENDENT_AMBULATORY_CARE_PROVIDER_SITE_OTHER): Payer: Medicare Other | Admitting: Pharmacist

## 2013-09-13 DIAGNOSIS — I4891 Unspecified atrial fibrillation: Secondary | ICD-10-CM | POA: Insufficient documentation

## 2013-09-13 LAB — POCT INR: INR: 3.3

## 2013-10-05 ENCOUNTER — Ambulatory Visit (INDEPENDENT_AMBULATORY_CARE_PROVIDER_SITE_OTHER): Payer: Medicare Other | Admitting: Pharmacist

## 2013-10-05 DIAGNOSIS — I4891 Unspecified atrial fibrillation: Secondary | ICD-10-CM

## 2013-10-19 ENCOUNTER — Ambulatory Visit (INDEPENDENT_AMBULATORY_CARE_PROVIDER_SITE_OTHER): Payer: Medicare Other | Admitting: Pharmacist

## 2013-10-19 DIAGNOSIS — I4891 Unspecified atrial fibrillation: Secondary | ICD-10-CM

## 2013-10-19 LAB — POCT INR: INR: 2.1

## 2013-11-09 ENCOUNTER — Ambulatory Visit (INDEPENDENT_AMBULATORY_CARE_PROVIDER_SITE_OTHER): Payer: Medicare Other | Admitting: *Deleted

## 2013-11-09 DIAGNOSIS — I4891 Unspecified atrial fibrillation: Secondary | ICD-10-CM

## 2013-11-20 ENCOUNTER — Other Ambulatory Visit: Payer: Self-pay | Admitting: Family Medicine

## 2013-11-20 ENCOUNTER — Ambulatory Visit
Admission: RE | Admit: 2013-11-20 | Discharge: 2013-11-20 | Disposition: A | Discharge status: Home / Self Care | Payer: Medicare Other | Source: Ambulatory Visit | Attending: Family Medicine | Admitting: Family Medicine

## 2013-11-20 DIAGNOSIS — M549 Dorsalgia, unspecified: Secondary | ICD-10-CM

## 2013-11-23 ENCOUNTER — Ambulatory Visit (INDEPENDENT_AMBULATORY_CARE_PROVIDER_SITE_OTHER): Payer: Medicare Other | Admitting: Pharmacist

## 2013-11-23 DIAGNOSIS — I4891 Unspecified atrial fibrillation: Secondary | ICD-10-CM

## 2013-12-04 ENCOUNTER — Ambulatory Visit (INDEPENDENT_AMBULATORY_CARE_PROVIDER_SITE_OTHER): Payer: Medicare Other | Admitting: Pharmacist

## 2013-12-04 DIAGNOSIS — I4891 Unspecified atrial fibrillation: Secondary | ICD-10-CM

## 2013-12-25 ENCOUNTER — Ambulatory Visit (INDEPENDENT_AMBULATORY_CARE_PROVIDER_SITE_OTHER): Payer: Medicare Other

## 2013-12-25 DIAGNOSIS — I4891 Unspecified atrial fibrillation: Secondary | ICD-10-CM

## 2013-12-25 LAB — POCT INR: INR: 3.3

## 2014-01-07 ENCOUNTER — Ambulatory Visit (INDEPENDENT_AMBULATORY_CARE_PROVIDER_SITE_OTHER): Payer: Medicare Other | Admitting: *Deleted

## 2014-01-07 DIAGNOSIS — I4891 Unspecified atrial fibrillation: Secondary | ICD-10-CM

## 2014-01-07 LAB — POCT INR: INR: 1.8

## 2014-01-07 MED ORDER — WARFARIN SODIUM 1 MG PO TABS: 1.0000 mg | ORAL_TABLET | ORAL | Status: DC

## 2014-01-14 ENCOUNTER — Telehealth: Payer: Self-pay | Admitting: Cardiology

## 2014-01-14 NOTE — Telephone Encounter (Signed)
Pt's daughter called states pt went to walk in clinic over the weekend and she was started on azithromycin x 5 day course.  Advised may increase INR but effects usually minimal.  Advised to have pt increase vit K intake while on abx and keep scheduled f/u appt on 01/22/14.

## 2014-01-22 ENCOUNTER — Ambulatory Visit (INDEPENDENT_AMBULATORY_CARE_PROVIDER_SITE_OTHER): Payer: Medicare Other | Admitting: *Deleted

## 2014-01-22 DIAGNOSIS — I4891 Unspecified atrial fibrillation: Secondary | ICD-10-CM

## 2014-01-22 DIAGNOSIS — Z5181 Encounter for therapeutic drug level monitoring: Secondary | ICD-10-CM

## 2014-01-22 LAB — POCT INR: INR: 1.5

## 2014-02-05 ENCOUNTER — Ambulatory Visit (INDEPENDENT_AMBULATORY_CARE_PROVIDER_SITE_OTHER): Payer: Medicare Other | Admitting: Pharmacist

## 2014-02-05 DIAGNOSIS — I4891 Unspecified atrial fibrillation: Secondary | ICD-10-CM

## 2014-02-05 DIAGNOSIS — Z5181 Encounter for therapeutic drug level monitoring: Secondary | ICD-10-CM

## 2014-02-05 LAB — POCT INR: INR: 1.5

## 2014-02-18 ENCOUNTER — Ambulatory Visit (INDEPENDENT_AMBULATORY_CARE_PROVIDER_SITE_OTHER): Payer: Medicare Other | Admitting: Pharmacist

## 2014-02-18 DIAGNOSIS — I4891 Unspecified atrial fibrillation: Secondary | ICD-10-CM

## 2014-02-18 DIAGNOSIS — Z5181 Encounter for therapeutic drug level monitoring: Secondary | ICD-10-CM

## 2014-02-18 LAB — POCT INR: INR: 1.6

## 2014-03-08 ENCOUNTER — Encounter: Payer: Self-pay | Admitting: Cardiology

## 2014-03-08 ENCOUNTER — Ambulatory Visit (INDEPENDENT_AMBULATORY_CARE_PROVIDER_SITE_OTHER): Payer: Medicare Other | Admitting: *Deleted

## 2014-03-08 ENCOUNTER — Ambulatory Visit (INDEPENDENT_AMBULATORY_CARE_PROVIDER_SITE_OTHER): Payer: Medicare Other | Admitting: Cardiology

## 2014-03-08 VITALS — BP 112/58 | HR 80 | Ht 60.0 in | Wt 96.0 lb

## 2014-03-08 DIAGNOSIS — I10 Essential (primary) hypertension: Secondary | ICD-10-CM

## 2014-03-08 DIAGNOSIS — Z7901 Long term (current) use of anticoagulants: Secondary | ICD-10-CM

## 2014-03-08 DIAGNOSIS — E43 Unspecified severe protein-calorie malnutrition: Secondary | ICD-10-CM

## 2014-03-08 DIAGNOSIS — I4891 Unspecified atrial fibrillation: Secondary | ICD-10-CM

## 2014-03-08 DIAGNOSIS — R0989 Other specified symptoms and signs involving the circulatory and respiratory systems: Secondary | ICD-10-CM

## 2014-03-08 DIAGNOSIS — Z5181 Encounter for therapeutic drug level monitoring: Secondary | ICD-10-CM

## 2014-03-08 DIAGNOSIS — R0609 Other forms of dyspnea: Secondary | ICD-10-CM

## 2014-03-08 DIAGNOSIS — R0600 Dyspnea, unspecified: Secondary | ICD-10-CM | POA: Insufficient documentation

## 2014-03-08 LAB — POCT INR: INR: 2.5

## 2014-03-08 NOTE — Patient Instructions (Signed)
Your physician has recommended you make the following change in your medication:   1. Take Clonidine for the next three days at night then stop completely.  Your physician wants you to follow-up in: 6 months with Dr. Syliva OvermanSkians. You will receive a reminder letter in the mail two months in advance. If you don't receive a letter, please call our office to schedule the follow-up appointment.

## 2014-03-08 NOTE — Progress Notes (Signed)
[address] [place], [place]  [address] Phone: [phone] Fax:  [phone]  Date:  03/08/2014   ID:  [name], DOB 05-Apr-1917, MRN [id number]  PCP:  No primary provider on file.   History of Present Illness: [name] is a 81 y.o. female with atrial fibrillation persistent with rate control, chronic anticoagulation with chronic complaint of intermittent shortness of breath. Echocardiogram with normal EF. Kyphotic/restricted. No orthopnea.  She also stated that occasionally she will feel panicky and will have increased shortness of breath off and on. No syncope. She worries. She visibly starts to hyperventilate at times. Denies any chest pain, syncope. She had a bad cold virus in February 2015.   Wt Readings from Last 3 Encounters:  03/08/14 96 lb (43.545 kg)     Past Medical History  Diagnosis Date  . HTN (hypertension)   . Arthritis, hip     , Left  . Insomnia   . Celiac disease   . Headaches, cluster   . Atrial fibrillation     No past surgical history on file.  Current Outpatient Prescriptions  Medication Sig Dispense Refill  . acetaminophen (TYLENOL) 500 MG tablet Take 500 mg by mouth every 6 (six) hours as needed.      . Calcium Carbonate-Vit D-Min (CALCIUM 1200 PO) Take 1 tablet by mouth daily.      . cloNIDine (CATAPRES) 0.1 MG tablet Take 0.1 mg by mouth 2 (two) times daily.      Marland Kitchen diltiazem (CARDIZEM CD) 120 MG 24 hr capsule Take 1 capsule by mouth 2 (two) times daily.      . furosemide (LASIX) 40 MG tablet Take 40 mg by mouth daily.      Marland Kitchen losartan (COZAAR) 50 MG tablet Take 50 mg by mouth daily.      . metoprolol succinate (TOPROL-XL) 100 MG 24 hr tablet Take 150 mg by mouth daily. Take with or immediately following a meal.      . Multiple Vitamin (MULTIVITAMIN) tablet Take 1 tablet by mouth daily.      . vitamin E 400 UNIT capsule Take 400 Units by mouth daily.      Marland Kitchen warfarin (COUMADIN) 1 MG tablet Take 1 tablet (1 mg  total) by mouth as directed.  40 tablet  3   No current facility-administered medications for this visit.    Allergies:    Allergies  Allergen Reactions  . Asa [Aspirin]     Floating  . Codeine   . Sulfa Antibiotics     Social History:  The patient  reports that she has never smoked. She does not have any smokeless tobacco history on file. She reports that she does not drink alcohol or use illicit drugs.   ROS:  Please see the history of present illness.   No bleeding with Coumadin, no strokelike symptoms, no fevers, no chills. No chest pain  PHYSICAL EXAM: VS:  BP 112/58  Pulse 80  Ht 5' (1.524 m)  Wt 96 lb (43.545 kg)  BMI 18.75 kg/m2 Thin, elderly, in no acute distress HEENT: normal Neck: no JVD Cardiac:  Irregularly irregular, normal rate, no murmur Lungs:  clear to auscultation bilaterally, no wheezing, rhonchi or rales Abd: soft, nontender, no hepatomegaly Ext: no edema Skin: warm and dry Neuro: no focal abnormalities noted  EKG:  None today    ASSESSMENT AND PLAN:  1. Atrial fibrillation-currently reasonable rate control. No changes made. 2. Hypertension-blood  pressure is fairly low. I will taper off of her clonidine. Continue to monitor blood pressure. Continue other medications. 3. Chronic anticoagulation-she remains on Coumadin. No bleeding. She is at increased risk for bleeding. Understands increased risk for stroke as well without anticoagulation. 4. Shortness of breath-intermittent, anxiety related as well. 5. Protein malnutrition-continue to encourage ensure calorie   Signed, Donato SchultzMark Sonny Poth, MD [facility]  03/08/2014 1:30 PM

## 2014-04-04 ENCOUNTER — Ambulatory Visit (INDEPENDENT_AMBULATORY_CARE_PROVIDER_SITE_OTHER): Payer: Medicare Other

## 2014-04-04 DIAGNOSIS — I4891 Unspecified atrial fibrillation: Secondary | ICD-10-CM

## 2014-04-04 DIAGNOSIS — Z5181 Encounter for therapeutic drug level monitoring: Secondary | ICD-10-CM

## 2014-04-04 LAB — POCT INR: INR: 1.8

## 2014-04-25 ENCOUNTER — Ambulatory Visit (INDEPENDENT_AMBULATORY_CARE_PROVIDER_SITE_OTHER): Payer: Medicare Other | Admitting: Pharmacist

## 2014-04-25 DIAGNOSIS — Z5181 Encounter for therapeutic drug level monitoring: Secondary | ICD-10-CM

## 2014-04-25 DIAGNOSIS — I4891 Unspecified atrial fibrillation: Secondary | ICD-10-CM

## 2014-04-25 LAB — POCT INR: INR: 1.8

## 2014-05-03 ENCOUNTER — Encounter (INDEPENDENT_AMBULATORY_CARE_PROVIDER_SITE_OTHER): Payer: Self-pay

## 2014-05-03 ENCOUNTER — Other Ambulatory Visit: Payer: Self-pay | Admitting: Family Medicine

## 2014-05-03 ENCOUNTER — Ambulatory Visit
Admission: RE | Admit: 2014-05-03 | Discharge: 2014-05-03 | Disposition: A | Discharge status: Home / Self Care | Payer: Medicare Other | Source: Ambulatory Visit | Attending: Family Medicine | Admitting: Family Medicine

## 2014-05-03 DIAGNOSIS — R0602 Shortness of breath: Secondary | ICD-10-CM

## 2014-05-09 ENCOUNTER — Ambulatory Visit (INDEPENDENT_AMBULATORY_CARE_PROVIDER_SITE_OTHER): Payer: Medicare Other | Admitting: *Deleted

## 2014-05-09 DIAGNOSIS — I4891 Unspecified atrial fibrillation: Secondary | ICD-10-CM

## 2014-05-09 DIAGNOSIS — Z5181 Encounter for therapeutic drug level monitoring: Secondary | ICD-10-CM

## 2014-05-09 LAB — POCT INR: INR: 2

## 2014-05-24 ENCOUNTER — Inpatient Hospital Stay (HOSPITAL_COMMUNITY)
Admission: EM | Admit: 2014-05-24 | Discharge: 2014-05-31 | DRG: 871 | Disposition: A | Discharge status: Other Acute Inpatient Hospital | Payer: Medicare Other | Attending: Internal Medicine | Admitting: Internal Medicine

## 2014-05-24 ENCOUNTER — Encounter (HOSPITAL_COMMUNITY): Payer: Self-pay | Admitting: Emergency Medicine

## 2014-05-24 ENCOUNTER — Emergency Department (HOSPITAL_COMMUNITY): Payer: Medicare Other

## 2014-05-24 DIAGNOSIS — Z681 Body mass index (BMI) 19 or less, adult: Secondary | ICD-10-CM

## 2014-05-24 DIAGNOSIS — R6520 Severe sepsis without septic shock: Secondary | ICD-10-CM

## 2014-05-24 DIAGNOSIS — J811 Chronic pulmonary edema: Secondary | ICD-10-CM

## 2014-05-24 DIAGNOSIS — F411 Generalized anxiety disorder: Secondary | ICD-10-CM | POA: Diagnosis present

## 2014-05-24 DIAGNOSIS — K900 Celiac disease: Secondary | ICD-10-CM | POA: Diagnosis present

## 2014-05-24 DIAGNOSIS — E876 Hypokalemia: Secondary | ICD-10-CM | POA: Diagnosis not present

## 2014-05-24 DIAGNOSIS — E43 Unspecified severe protein-calorie malnutrition: Secondary | ICD-10-CM | POA: Diagnosis present

## 2014-05-24 DIAGNOSIS — Z515 Encounter for palliative care: Secondary | ICD-10-CM

## 2014-05-24 DIAGNOSIS — J9600 Acute respiratory failure, unspecified whether with hypoxia or hypercapnia: Secondary | ICD-10-CM | POA: Diagnosis present

## 2014-05-24 DIAGNOSIS — I509 Heart failure, unspecified: Secondary | ICD-10-CM | POA: Diagnosis present

## 2014-05-24 DIAGNOSIS — Z5181 Encounter for therapeutic drug level monitoring: Secondary | ICD-10-CM

## 2014-05-24 DIAGNOSIS — J918 Pleural effusion in other conditions classified elsewhere: Secondary | ICD-10-CM

## 2014-05-24 DIAGNOSIS — I10 Essential (primary) hypertension: Secondary | ICD-10-CM | POA: Diagnosis present

## 2014-05-24 DIAGNOSIS — R1310 Dysphagia, unspecified: Secondary | ICD-10-CM | POA: Diagnosis present

## 2014-05-24 DIAGNOSIS — I5033 Acute on chronic diastolic (congestive) heart failure: Secondary | ICD-10-CM | POA: Diagnosis present

## 2014-05-24 DIAGNOSIS — I482 Chronic atrial fibrillation, unspecified: Secondary | ICD-10-CM

## 2014-05-24 DIAGNOSIS — G4700 Insomnia, unspecified: Secondary | ICD-10-CM | POA: Diagnosis present

## 2014-05-24 DIAGNOSIS — J189 Pneumonia, unspecified organism: Secondary | ICD-10-CM | POA: Diagnosis present

## 2014-05-24 DIAGNOSIS — I4891 Unspecified atrial fibrillation: Secondary | ICD-10-CM | POA: Diagnosis present

## 2014-05-24 DIAGNOSIS — M1610 Unilateral primary osteoarthritis, unspecified hip: Secondary | ICD-10-CM | POA: Diagnosis present

## 2014-05-24 DIAGNOSIS — Z882 Allergy status to sulfonamides status: Secondary | ICD-10-CM

## 2014-05-24 DIAGNOSIS — Z885 Allergy status to narcotic agent status: Secondary | ICD-10-CM

## 2014-05-24 DIAGNOSIS — Z886 Allergy status to analgesic agent status: Secondary | ICD-10-CM

## 2014-05-24 DIAGNOSIS — R0600 Dyspnea, unspecified: Secondary | ICD-10-CM

## 2014-05-24 DIAGNOSIS — J90 Pleural effusion, not elsewhere classified: Secondary | ICD-10-CM

## 2014-05-24 DIAGNOSIS — E872 Acidosis, unspecified: Secondary | ICD-10-CM | POA: Diagnosis present

## 2014-05-24 DIAGNOSIS — Z8249 Family history of ischemic heart disease and other diseases of the circulatory system: Secondary | ICD-10-CM

## 2014-05-24 DIAGNOSIS — E8729 Other acidosis: Secondary | ICD-10-CM

## 2014-05-24 DIAGNOSIS — A419 Sepsis, unspecified organism: Secondary | ICD-10-CM | POA: Diagnosis present

## 2014-05-24 DIAGNOSIS — Z7901 Long term (current) use of anticoagulants: Secondary | ICD-10-CM

## 2014-05-24 DIAGNOSIS — R0902 Hypoxemia: Secondary | ICD-10-CM

## 2014-05-24 HISTORY — DX: Cardiac arrhythmia, unspecified: I49.9

## 2014-05-24 HISTORY — DX: Anxiety disorder, unspecified: F41.9

## 2014-05-24 HISTORY — DX: Other complications of anesthesia, initial encounter: T88.59XA

## 2014-05-24 HISTORY — DX: Anemia, unspecified: D64.9

## 2014-05-24 HISTORY — DX: Adverse effect of unspecified anesthetic, initial encounter: T41.45XA

## 2014-05-24 HISTORY — DX: Shortness of breath: R06.02

## 2014-05-24 LAB — BLOOD GAS, ARTERIAL
Acid-Base Excess: 7.6 mmol/L — ABNORMAL HIGH (ref 0.0–2.0)
BICARBONATE: 33.8 meq/L — AB (ref 20.0–24.0)
Delivery systems: POSITIVE
Drawn by: 257701
EXPIRATORY PAP: 6
FIO2: 1 %
Inspiratory PAP: 12
Mode: POSITIVE
O2 Saturation: 99.9 %
PH ART: 7.306 — AB (ref 7.350–7.450)
PO2 ART: 314 mmHg — AB (ref 80.0–100.0)
Patient temperature: 98.6
RATE: 12 resp/min
TCO2: 35.9 mmol/L (ref 0–100)
pCO2 arterial: 69.9 mmHg (ref 35.0–45.0)

## 2014-05-24 LAB — CBC WITH DIFFERENTIAL/PLATELET
BASOS PCT: 0 % (ref 0–1)
Basophils Absolute: 0 10*3/uL (ref 0.0–0.1)
EOS ABS: 0 10*3/uL (ref 0.0–0.7)
EOS PCT: 0 % (ref 0–5)
HCT: 39 % (ref 36.0–46.0)
HEMOGLOBIN: 12.1 g/dL (ref 12.0–15.0)
Lymphocytes Relative: 3 % — ABNORMAL LOW (ref 12–46)
Lymphs Abs: 0.6 10*3/uL — ABNORMAL LOW (ref 0.7–4.0)
MCH: 27.3 pg (ref 26.0–34.0)
MCHC: 31 g/dL (ref 30.0–36.0)
MCV: 88 fL (ref 78.0–100.0)
MONOS PCT: 10 % (ref 3–12)
Monocytes Absolute: 1.8 10*3/uL — ABNORMAL HIGH (ref 0.1–1.0)
Neutro Abs: 15.7 10*3/uL — ABNORMAL HIGH (ref 1.7–7.7)
Neutrophils Relative %: 87 % — ABNORMAL HIGH (ref 43–77)
Platelets: 316 10*3/uL (ref 150–400)
RBC: 4.43 MIL/uL (ref 3.87–5.11)
RDW: 15.6 % — ABNORMAL HIGH (ref 11.5–15.5)
WBC: 18.1 10*3/uL — ABNORMAL HIGH (ref 4.0–10.5)

## 2014-05-24 LAB — I-STAT ARTERIAL BLOOD GAS, ED
Acid-Base Excess: 5 mmol/L — ABNORMAL HIGH (ref 0.0–2.0)
BICARBONATE: 35 meq/L — AB (ref 20.0–24.0)
O2 SAT: 90 %
Patient temperature: 98.6
TCO2: 37 mmol/L (ref 0–100)
pCO2 arterial: 81.2 mmHg (ref 35.0–45.0)
pH, Arterial: 7.243 — ABNORMAL LOW (ref 7.350–7.450)
pO2, Arterial: 71 mmHg — ABNORMAL LOW (ref 80.0–100.0)

## 2014-05-24 LAB — GLUCOSE, CAPILLARY: GLUCOSE-CAPILLARY: 135 mg/dL — AB (ref 70–99)

## 2014-05-24 LAB — I-STAT CG4 LACTIC ACID, ED: Lactic Acid, Venous: 2.31 mmol/L — ABNORMAL HIGH (ref 0.5–2.2)

## 2014-05-24 LAB — BASIC METABOLIC PANEL
BUN: 19 mg/dL (ref 6–23)
CALCIUM: 9.4 mg/dL (ref 8.4–10.5)
CO2: 30 mEq/L (ref 19–32)
Chloride: 93 mEq/L — ABNORMAL LOW (ref 96–112)
Creatinine, Ser: 0.58 mg/dL (ref 0.50–1.10)
GFR calc Af Amer: 89 mL/min — ABNORMAL LOW (ref >90)
GFR, EST NON AFRICAN AMERICAN: 76 mL/min — AB (ref >90)
Glucose, Bld: 190 mg/dL — ABNORMAL HIGH (ref 70–99)
POTASSIUM: 4.2 meq/L (ref 3.7–5.3)
Sodium: 136 mEq/L — ABNORMAL LOW (ref 137–147)

## 2014-05-24 LAB — MRSA PCR SCREENING: MRSA by PCR: NEGATIVE

## 2014-05-24 LAB — HEMOGLOBIN A1C
HEMOGLOBIN A1C: 5.7 % — AB (ref <5.7)
MEAN PLASMA GLUCOSE: 117 mg/dL — AB (ref <117)

## 2014-05-24 LAB — TSH: TSH: 0.972 u[IU]/mL (ref 0.350–4.500)

## 2014-05-24 LAB — I-STAT TROPONIN, ED: Troponin i, poc: 0.02 ng/mL (ref 0.00–0.08)

## 2014-05-24 LAB — STREP PNEUMONIAE URINARY ANTIGEN: Strep Pneumo Urinary Antigen: NEGATIVE

## 2014-05-24 LAB — PRO B NATRIURETIC PEPTIDE: PRO B NATRI PEPTIDE: 3352 pg/mL — AB (ref 0–450)

## 2014-05-24 LAB — PROTIME-INR
INR: 4.62 — ABNORMAL HIGH (ref 0.00–1.49)
Prothrombin Time: 41.8 seconds — ABNORMAL HIGH (ref 11.6–15.2)

## 2014-05-24 MED ORDER — ASPIRIN 81 MG PO CHEW: 324.0000 mg | CHEWABLE_TABLET | Freq: Once | ORAL | Status: DC

## 2014-05-24 MED ORDER — DILTIAZEM HCL ER COATED BEADS 120 MG PO CP24
120.0000 mg | ORAL_CAPSULE | Freq: Two times a day (BID) | ORAL | Status: DC
  Administered 2014-05-25 – 2014-05-31 (×12): 120 mg via ORAL
  Filled 2014-05-24 (×21): qty 1

## 2014-05-24 MED ORDER — DEXTROSE 5 % IV SOLN
1.0000 g | INTRAVENOUS | Status: DC
  Administered 2014-05-24: 1 g via INTRAVENOUS
  Filled 2014-05-24: qty 10

## 2014-05-24 MED ORDER — ONDANSETRON HCL 4 MG/2ML IJ SOLN: 4.0000 mg | Freq: Four times a day (QID) | INTRAMUSCULAR | Status: DC | PRN

## 2014-05-24 MED ORDER — NITROGLYCERIN 0.4 MG SL SUBL
0.4000 mg | SUBLINGUAL_TABLET | SUBLINGUAL | Status: DC | PRN
Start: 2014-05-24 — End: 2014-05-31

## 2014-05-24 MED ORDER — IPRATROPIUM-ALBUTEROL 0.5-2.5 (3) MG/3ML IN SOLN
3.0000 mL | Freq: Once | RESPIRATORY_TRACT | Status: AC
  Administered 2014-05-24: 3 mL via RESPIRATORY_TRACT
  Filled 2014-05-24: qty 3

## 2014-05-24 MED ORDER — METOPROLOL SUCCINATE ER 25 MG PO TB24
25.0000 mg | ORAL_TABLET | Freq: Every day | ORAL | Status: DC
  Administered 2014-05-25 – 2014-05-31 (×7): 25 mg via ORAL
  Filled 2014-05-24 (×7): qty 1

## 2014-05-24 MED ORDER — FUROSEMIDE 10 MG/ML IJ SOLN
40.0000 mg | Freq: Once | INTRAMUSCULAR | Status: AC
  Administered 2014-05-24: 40 mg via INTRAVENOUS
  Filled 2014-05-24: qty 4

## 2014-05-24 MED ORDER — SENNOSIDES-DOCUSATE SODIUM 8.6-50 MG PO TABS
1.0000 | ORAL_TABLET | Freq: Every evening | ORAL | Status: DC | PRN
  Administered 2014-05-27: 1 via ORAL
  Filled 2014-05-24: qty 1

## 2014-05-24 MED ORDER — ASPIRIN 300 MG RE SUPP
300.0000 mg | Freq: Once | RECTAL | Status: AC
  Administered 2014-05-24: 300 mg via RECTAL
  Filled 2014-05-24: qty 1

## 2014-05-24 MED ORDER — ALUM & MAG HYDROXIDE-SIMETH 200-200-20 MG/5ML PO SUSP: 30.0000 mL | Freq: Four times a day (QID) | ORAL | Status: DC | PRN

## 2014-05-24 MED ORDER — GUAIFENESIN-DM 100-10 MG/5ML PO SYRP
5.0000 mL | ORAL_SOLUTION | ORAL | Status: DC | PRN
Start: 2014-05-24 — End: 2014-05-31
  Filled 2014-05-24: qty 5

## 2014-05-24 MED ORDER — ACETAMINOPHEN 500 MG PO TABS: 500.0000 mg | ORAL_TABLET | Freq: Four times a day (QID) | ORAL | Status: DC | PRN

## 2014-05-24 MED ORDER — METOPROLOL SUCCINATE ER 25 MG PO TB24: 25.0000 mg | ORAL_TABLET | Freq: Every day | ORAL | Status: DC

## 2014-05-24 MED ORDER — WARFARIN - PHARMACIST DOSING INPATIENT
Freq: Every day | Status: DC
  Administered 2014-05-28: 1

## 2014-05-24 MED ORDER — DEXTROSE 5 % IV SOLN
500.0000 mg | Freq: Once | INTRAVENOUS | Status: DC
  Administered 2014-05-24: 500 mg via INTRAVENOUS
  Filled 2014-05-24: qty 500

## 2014-05-24 MED ORDER — MORPHINE SULFATE 2 MG/ML IJ SOLN
1.0000 mg | INTRAMUSCULAR | Status: DC | PRN
  Administered 2014-05-25 – 2014-05-27 (×4): 1 mg via INTRAVENOUS
  Filled 2014-05-24 (×5): qty 1

## 2014-05-24 MED ORDER — CEFTRIAXONE SODIUM 1 G IJ SOLR
1.0000 g | INTRAMUSCULAR | Status: DC
  Administered 2014-05-25 – 2014-05-29 (×5): 1 g via INTRAVENOUS
  Filled 2014-05-24 (×6): qty 10

## 2014-05-24 MED ORDER — ONDANSETRON HCL 4 MG PO TABS: 4.0000 mg | ORAL_TABLET | Freq: Four times a day (QID) | ORAL | Status: DC | PRN

## 2014-05-24 MED ORDER — ALBUTEROL SULFATE (2.5 MG/3ML) 0.083% IN NEBU: 2.5000 mg | INHALATION_SOLUTION | RESPIRATORY_TRACT | Status: DC | PRN

## 2014-05-24 MED ORDER — FUROSEMIDE 10 MG/ML IJ SOLN
40.0000 mg | Freq: Two times a day (BID) | INTRAMUSCULAR | Status: DC
  Administered 2014-05-25 – 2014-05-26 (×4): 40 mg via INTRAVENOUS
  Filled 2014-05-24 (×7): qty 4

## 2014-05-24 MED ORDER — CHLORHEXIDINE GLUCONATE 0.12 % MT SOLN
15.0000 mL | Freq: Two times a day (BID) | OROMUCOSAL | Status: DC
  Administered 2014-05-24 – 2014-05-26 (×5): 15 mL via OROMUCOSAL
  Filled 2014-05-24 (×8): qty 15

## 2014-05-24 MED ORDER — BIOTENE DRY MOUTH MT LIQD
15.0000 mL | Freq: Two times a day (BID) | OROMUCOSAL | Status: DC
  Administered 2014-05-25 – 2014-05-27 (×4): 15 mL via OROMUCOSAL

## 2014-05-24 MED ORDER — DEXTROSE 5 % IV SOLN
500.0000 mg | INTRAVENOUS | Status: DC
  Administered 2014-05-25 – 2014-05-29 (×5): 500 mg via INTRAVENOUS
  Filled 2014-05-24 (×7): qty 500

## 2014-05-24 NOTE — ED Notes (Signed)
Lactic acid results given to Ward, DO 

## 2014-05-24 NOTE — Progress Notes (Signed)
Informed Dr. Lendell CapriceSullivan advised of b/p of 81/43...manual b/p 80/40. B/p on the monitor 95/57... Instructed to hold off boluses at this time.. Will continue to monitor..Marland Kitchen

## 2014-05-24 NOTE — ED Notes (Signed)
Consulting MD at bedside

## 2014-05-24 NOTE — ED Notes (Addendum)
Pt denies any chest pressure or pain at this time

## 2014-05-24 NOTE — Progress Notes (Signed)
Removed patient so she could eat. Placed patient on oxygen set at 3lpm. Sp02 at this time is 97% will continue to monitor patient

## 2014-05-24 NOTE — ED Provider Notes (Signed)
TIME SEEN: 11:36 AM  CHIEF COMPLAINT: Chest pain, cough, weakness, shortness of breath  HPI: Patient is a 80 year old female with history of hypertension, A. fib on Coumadin who presents to the emergency department with complaints of left-sided chest pressure that radiates into her back it started yesterday with associated generalized weakness. She's also had chronic shortness of breath but states this is unchanged. Family reports that she always has tachypnea but does not wear oxygen at home. No history of PE or DVT. No history of CAD or CHF. [facility]. She denies any fever but has had a cough.  Family denies a history of tobacco use, COPD or asthma.  ROS: See HPI Constitutional: no fever  Eyes: no drainage  ENT: no runny nose   Cardiovascular:  chest pain  Resp:  SOB  GI: no vomiting GU: no dysuria Integumentary: no rash  Allergy: no hives  Musculoskeletal: no leg swelling  Neurological: no slurred speech ROS otherwise negative  PAST MEDICAL HISTORY/PAST SURGICAL HISTORY:  Past Medical History  Diagnosis Date  . HTN (hypertension)   . Arthritis, hip     , Left  . Insomnia   . Celiac disease   . Headaches, cluster   . Atrial fibrillation     MEDICATIONS:  Prior to Admission medications   Medication Sig Start Date End Date Taking? Authorizing Provider  acetaminophen (TYLENOL) 500 MG tablet Take 500 mg by mouth every 6 (six) hours as needed.    Historical Provider, MD  Calcium Carbonate-Vit D-Min (CALCIUM 1200 PO) Take 1 tablet by mouth daily.    Historical Provider, MD  cloNIDine (CATAPRES) 0.1 MG tablet  03/05/14   Historical Provider, MD  diltiazem (CARDIZEM CD) 120 MG 24 hr capsule Take 1 capsule by mouth 2 (two) times daily. 02/28/14   Historical Provider, MD  furosemide (LASIX) 40 MG tablet Take 40 mg by mouth daily.    Historical Provider, MD  losartan (COZAAR) 50 MG tablet Take 50 mg by mouth daily.    Historical Provider, MD  metoprolol succinate  (TOPROL-XL) 100 MG 24 hr tablet Take 150 mg by mouth daily. Take with or immediately following a meal.    Historical Provider, MD  Multiple Vitamin (MULTIVITAMIN) tablet Take 1 tablet by mouth daily.    Historical Provider, MD  vitamin E 400 UNIT capsule Take 400 Units by mouth daily.    Historical Provider, MD  warfarin (COUMADIN) 1 MG tablet Take 1 tablet (1 mg total) by mouth as directed. 01/07/14   Donato SchultzMark Skains, MD    ALLERGIES:  Allergies  Allergen Reactions  . Asa [Aspirin]     Floating  . Codeine   . Sulfa Antibiotics     SOCIAL HISTORY:  History  Substance Use Topics  . Smoking status: Never Smoker   . Smokeless tobacco: Not on file  . Alcohol Use: No    FAMILY HISTORY: Family History  Problem Relation Age of Onset  . Coronary artery disease Father   . Rheumatic fever Sister   . Mitral valve prolapse Sister   . AAA (abdominal aortic aneurysm) Sister     EXAM: BP 195/85  Pulse 116  Temp(Src) 98 F (36.7 C) (Oral)  Resp 20  SpO2 97% CONSTITUTIONAL: Alert and oriented and responds appropriately to questions. Well-appearing; well-nourished HEAD: Normocephalic EYES: Conjunctivae clear, PERRL ENT: normal nose; no rhinorrhea; moist mucous membranes; pharynx without lesions noted NECK: Supple, no meningismus, no LAD  CARD: RRR; S1 and S2 appreciated; no  murmurs, no clicks, no rubs, no gallops RESP: Normal chest excursion without splinting the patient is tachypneic, speaking short sentences, diminished breath sounds diffusely with bibasilar crackles, mild expiratory wheezing, hypoxia, moderate respiratory distress ABD/GI: Normal bowel sounds; non-distended; soft, non-tender, no rebound, no guarding BACK:  The back appears normal and is non-tender to palpation, there is no CVA tenderness EXT: Normal ROM in all joints; non-tender to palpation; no edema; normal capillary refill; no cyanosis    SKIN: Normal color for age and race; warm NEURO: Moves all extremities  equally PSYCH: The patient's mood and manner are appropriate. Grooming and personal hygiene are appropriate.  MEDICAL DECISION MAKING: Patient here with moderate respiratory distress. Differential diagnosis includes pneumonia, COPD, CHF, PE, CAD. We'll give duo neb. Her symptoms have improved on oxygen. At home, patient's oxygen saturation was 57% on room air with EMS and was 70% on room air here in the ED. Will obtain ABG, cardiac labs, portable chest x-ray. Patient will need admission given her new oxygen requirement.  ED PROGRESS: Patient's chest x-ray shows basilar infiltrates versus edema. She does have a leukocytosis with left shift. We'll treat for pneumonia with ceftriaxone and azithromycin and give Lasix as well. Her ABG shows a respiratory acidosis. We'll start BiPAP. We'll discuss with medicine for admission to step down.   1:04 PM  Pt is improving on BiPAP. She is not sure if she is a full code at this time. Discussed with Dr. Lendell CapriceSullivan with hospitalist service who will see the patient in the ED the    CRITICAL CARE Performed by: WARD, KRISTEN N   Total critical care time: 45 minutes  Critical care time was exclusive of separately billable procedures and treating other patients.  Critical care was necessary to treat or prevent imminent or life-threatening deterioration.  Critical care was time spent personally by me on the following activities: development of treatment plan with patient and/or surrogate as well as nursing, discussions with consultants, evaluation of patient's response to treatment, examination of patient, obtaining history from patient or surrogate, ordering and performing treatments and interventions, ordering and review of laboratory studies, ordering and review of radiographic studies, pulse oximetry and re-evaluation of patient's condition.      EKG Interpretation  Date/Time:  Friday May 24 2014 11:15:58 EDT Ventricular Rate:  117 PR Interval:  191 QRS  Duration: 82 QT Interval:  344 QTC Calculation: 480 R Axis:   107 Text Interpretation:  Atrial fibrillation with RVR Ventricular bigeminy Lateral infarct, old Baseline wander in lead(s) III Artifact Confirmed by WARD,  DO, KRISTEN ([id number]) on 05/24/2014 12:06:57 PM        Layla MawKristen N Ward, DO 05/24/14 1304

## 2014-05-24 NOTE — Progress Notes (Signed)
ANTICOAGULATION CONSULT NOTE - Initial Consult  Pharmacy Consult for coumadin Indication: atrial fibrillation  Allergies  Allergen Reactions  . Codeine Other (See Comments)    Reaction unknown  . Sulfa Antibiotics Other (See Comments)    Reaction unknown    Patient Measurements: Height: 4\' 11"  (149.9 cm) Weight: 92 lb 2.4 oz (41.8 kg) IBW/kg (Calculated) : 43.2   Vital Signs: Temp: 97.7 F (36.5 C) (06/12 1600) Temp src: Axillary (06/12 1600) BP: 93/42 mmHg (06/12 1600) Pulse Rate: 55 (06/12 1600)  Labs:  Recent Labs  05/24/14 1135  HGB 12.1  HCT 39.0  PLT 316  LABPROT 41.8*  INR 4.62*  CREATININE 0.58    Estimated Creatinine Clearance: 28.4 ml/min (by C-G formula based on Cr of 0.58).   Medical History: Past Medical History  Diagnosis Date  . HTN (hypertension)   . Arthritis, hip     , Left  . Insomnia   . Celiac disease   . Headaches, cluster   . Atrial fibrillation     Medications:  Prescriptions prior to admission  Medication Sig Dispense Refill  . acetaminophen (TYLENOL) 500 MG tablet Take 500 mg by mouth every 6 (six) hours as needed for mild pain.       Marland Kitchen. diltiazem (CARDIZEM CD) 120 MG 24 hr capsule Take 1 capsule by mouth 2 (two) times daily.      . ferrous sulfate 325 (65 FE) MG tablet Take 325 mg by mouth daily with breakfast.      . furosemide (LASIX) 40 MG tablet Take 40 mg by mouth daily.      Marland Kitchen. losartan (COZAAR) 50 MG tablet Take 25 mg by mouth daily.       . metoprolol succinate (TOPROL-XL) 100 MG 24 hr tablet Take 150 mg by mouth daily. Take with or immediately following a meal.      . Multiple Vitamin (MULTIVITAMIN) tablet Take 1 tablet by mouth daily.      Marland Kitchen. warfarin (COUMADIN) 1 MG tablet Take 1.5 mg by mouth daily.      . vitamin E 400 UNIT capsule Take 400 Units by mouth daily.        Assessment: 80 yo female here with CP with possible PNA and noted on coumadin at home for afib. Pharmacy has been consulted to dose coumadin, INR  today= 4.62 (last INR as outpatient was 2.0 05/09/14).   Home coumadin dose: 1.5mg /day (last dose 05/23/14)  Goal of Therapy:  INR 2-3 Monitor platelets by anticoagulation protocol: Yes   Plan:  -Hold coumadin tonight -Daily PT/INR  Harland GermanAndrew Atley Scarboro, Pharm D 05/24/2014 4:19 PM

## 2014-05-24 NOTE — ED Notes (Signed)
Per EMS- pt has had increased weakness, productive cough and chest discomfort. Pt has had "panting" type breathing per family for months with some generalized weakness.

## 2014-05-24 NOTE — H&P (Signed)
[facility] History and Physical  [name] UJW:[id number] DOB: [birth year] DOA: 05/24/2014  Referring physician: Ward PCP: Sissy HoffSWAYNE,DAVID W, MD   Chief Complaint: dyspnea and chest pressure  HPI: [name] is a 80 y.o. female  With a h/o chronic A fib on coumadin, diastolic HF, chronic dyspnea presents to ED with dyspnea and chest pressure.  sats reportedly in the 50's in the field. Now with sats in the 90s on bipap.  Has been weak, poor appetite, occasional loose stool, diaphoresis and subjective fevers at home.  Lives alone.  In ED, found to have respiratory acidosis, CXR showing CHF, possible pneumonia, leukocytosis, proBNP 3000. No CP currently.  Feels slightly better on bipap.  Review of Systems:  Difficult due to bipap. As above, otherwise negative  Past Medical History  Diagnosis Date  . HTN (hypertension)   . Arthritis, hip     , Left  . Insomnia   . Celiac disease   . Headaches, cluster   . Atrial fibrillation    History reviewed. No pertinent past surgical history. Social History:  reports that she has never smoked. She does not have any smokeless tobacco history on file. She reports that she does not drink alcohol or use illicit drugs.  Allergies  Allergen Reactions  . Codeine Other (See Comments)    Reaction unknown  . Sulfa Antibiotics Other (See Comments)    Reaction unknown    Family History  Problem Relation Age of Onset  . Coronary artery disease Father   . Rheumatic fever Sister   . Mitral valve prolapse Sister   . AAA (abdominal aortic aneurysm) Sister      Prior to Admission medications   Medication Sig Start Date End Date Taking? Authorizing Provider  acetaminophen (TYLENOL) 500 MG tablet Take 500 mg by mouth every 6 (six) hours as needed for mild pain.    Yes Historical Provider, MD  diltiazem (CARDIZEM CD) 120 MG 24 hr capsule Take 1 capsule by mouth 2 (two) times daily. 02/28/14  Yes Historical Provider, MD  ferrous sulfate 325  (65 FE) MG tablet Take 325 mg by mouth daily with breakfast.   Yes Historical Provider, MD  furosemide (LASIX) 40 MG tablet Take 40 mg by mouth daily.   Yes Historical Provider, MD  losartan (COZAAR) 50 MG tablet Take 25 mg by mouth daily.    Yes Historical Provider, MD  metoprolol succinate (TOPROL-XL) 100 MG 24 hr tablet Take 150 mg by mouth daily. Take with or immediately following a meal.   Yes Historical Provider, MD  Multiple Vitamin (MULTIVITAMIN) tablet Take 1 tablet by mouth daily.   Yes Historical Provider, MD  warfarin (COUMADIN) 1 MG tablet Take 1.5 mg by mouth daily.   Yes Historical Provider, MD  vitamin E 400 UNIT capsule Take 400 Units by mouth daily.    Historical Provider, MD   Physical Exam: Filed Vitals:   05/24/14 1315  BP:   Pulse: 116  Temp:   Resp: 24    BP 112/62  Pulse 116  Temp(Src) 98 F (36.7 C) (Oral)  Resp 24  SpO2 96% BP 93/42  Pulse 55  Temp(Src) 97.7 F (36.5 C) (Axillary)  Resp 23  Ht 4\' 11"  (1.499 m)  Wt 41.8 kg (92 lb 2.4 oz)  BMI 18.60 kg/m2  SpO2 100%  General Appearance:    Alert, cooperative, fairly comfortable on bipap. Answering questions  Head:    Normocephalic, without obvious abnormality, atraumatic  Eyes:  PERRL, conjunctiva/corneas clear, EOM's intact, fundi    benign, both eyes     Nose:   Nares normal, septum midline, mucosa normal, no drainage    or sinus tenderness  Throat:   Lips, mucosa, and tongue normal; teeth and gums normal  Neck:   Supple, symmetrical, trachea midline, no adenopathy;    thyroid:  no enlargement/tenderness/nodules; no carotid   bruit or JVD  Back:     Symmetric, no curvature, ROM normal, no CVA tenderness  Lungs:     Rales bilaterally  Chest Wall:    No tenderness or deformity   Heart:    Irregularly irregular without MGR     Abdomen:     Soft, non-tender, bowel sounds active all four quadrants,    no masses, no organomegaly  Genitalia:   deferred  Rectal:   deferred  Extremities:    Extremities normal, atraumatic, no cyanosis or edema  Pulses:   2+ and symmetric all extremities  Skin:   Extremities cool. Thready pulses  Lymph nodes:   Cervical, supraclavicular, and axillary nodes normal  Neurologic:   CNII-XII intact, normal strength, sensation and reflexes    throughout           Psych: cooperative, appropriate  Labs on Admission:  Basic Metabolic Panel:  Recent Labs Lab 05/24/14 1135  NA 136*  K 4.2  CL 93*  CO2 30  GLUCOSE 190*  BUN 19  CREATININE 0.58  CALCIUM 9.4   Liver Function Tests: No results found for this basename: AST, ALT, ALKPHOS, BILITOT, PROT, ALBUMIN,  in the last 168 hours No results found for this basename: LIPASE, AMYLASE,  in the last 168 hours No results found for this basename: AMMONIA,  in the last 168 hours CBC:  Recent Labs Lab 05/24/14 1135  WBC 18.1*  NEUTROABS 15.7*  HGB 12.1  HCT 39.0  MCV 88.0  PLT 316   Cardiac Enzymes: No results found for this basename: CKTOTAL, CKMB, CKMBINDEX, TROPONINI,  in the last 168 hours  BNP (last 3 results)  Recent Labs  05/24/14 1135  PROBNP 3352.0*   CBG: No results found for this basename: GLUCAP,  in the last 168 hours  Radiological Exams on Admission: Dg Chest Port 1 View  05/24/2014   CLINICAL DATA:  Shortness of breath.  EXAM: PORTABLE CHEST - 1 VIEW  COMPARISON:  Chest x-ray 05/03/2014.  FINDINGS: Cardiomegaly with pulmonary vascular prominence and bilateral interstitial prominence with bilateral pleural effusions are present consistent with congestive heart failure. Basilar alveolar infiltrates and/or atelectasis present. Basilar pneumonia cannot be excluded.  IMPRESSION: 1. Congestive heart failure with pulmonary edema and bilateral effusions . 2. Bibasilar pneumonia cannot be excluded.   Electronically Signed   By: Maisie Fushomas  Register   On: 05/24/2014 12:29    EKG: atrial fibrillation, wandering baseline  Assessment/Plan Principal Problem:   Acute respiratory  failure secondary to CHF and pneumonia: continue bipap. Admit to SDU. Repeat ABG in a few hours. Active Problems: supratherapeutic coumadin: hold.   Atrial fibrillation, chronic: see below   CAP (community acquired pneumonia): continue rocephin, azithro. Blood cultures and urine legionella, strep   Acute on chronic diastolic heart failure: IV lasix bid. Last echo 2012. Will repeat. Htn: continue metoprolol and diltiazem as blood pressure tolerates.  BP soft, so hold ARB  Code Status: limited: meds and intubation only Family Communication: daughters at bedside Disposition Plan: ?  Time spent: 9760 critical care  Christiane HaSULLIVAN,Tamica Covell L, MD [facility] Pager [phone]

## 2014-05-24 NOTE — Progress Notes (Signed)
Utilization review completed. Phallon Haydu, RN, BSN. 

## 2014-05-25 DIAGNOSIS — I369 Nonrheumatic tricuspid valve disorder, unspecified: Secondary | ICD-10-CM

## 2014-05-25 LAB — BLOOD GAS, ARTERIAL
Acid-Base Excess: 8.4 mmol/L — ABNORMAL HIGH (ref 0.0–2.0)
Bicarbonate: 34.4 mEq/L — ABNORMAL HIGH (ref 20.0–24.0)
DRAWN BY: 246861
O2 CONTENT: 2.5 L/min
O2 Saturation: 88.1 %
PCO2 ART: 68.4 mmHg — AB (ref 35.0–45.0)
Patient temperature: 98.6
TCO2: 36.5 mmol/L (ref 0–100)
pH, Arterial: 7.322 — ABNORMAL LOW (ref 7.350–7.450)
pO2, Arterial: 59.3 mmHg — ABNORMAL LOW (ref 80.0–100.0)

## 2014-05-25 LAB — COMPREHENSIVE METABOLIC PANEL
ALT: 24 U/L (ref 0–35)
AST: 21 U/L (ref 0–37)
Albumin: 2.8 g/dL — ABNORMAL LOW (ref 3.5–5.2)
Alkaline Phosphatase: 100 U/L (ref 39–117)
BILIRUBIN TOTAL: 0.3 mg/dL (ref 0.3–1.2)
BUN: 27 mg/dL — AB (ref 6–23)
CO2: 35 mEq/L — ABNORMAL HIGH (ref 19–32)
Calcium: 9.2 mg/dL (ref 8.4–10.5)
Chloride: 94 mEq/L — ABNORMAL LOW (ref 96–112)
Creatinine, Ser: 0.81 mg/dL (ref 0.50–1.10)
GFR calc Af Amer: 70 mL/min — ABNORMAL LOW (ref >90)
GFR calc non Af Amer: 60 mL/min — ABNORMAL LOW (ref >90)
GLUCOSE: 117 mg/dL — AB (ref 70–99)
POTASSIUM: 4.3 meq/L (ref 3.7–5.3)
Sodium: 138 mEq/L (ref 137–147)
Total Protein: 6.7 g/dL (ref 6.0–8.3)

## 2014-05-25 LAB — CBC WITH DIFFERENTIAL/PLATELET
BASOS ABS: 0 10*3/uL (ref 0.0–0.1)
BASOS PCT: 0 % (ref 0–1)
EOS ABS: 0 10*3/uL (ref 0.0–0.7)
EOS PCT: 0 % (ref 0–5)
HCT: 36.1 % (ref 36.0–46.0)
Hemoglobin: 10.9 g/dL — ABNORMAL LOW (ref 12.0–15.0)
LYMPHS PCT: 9 % — AB (ref 12–46)
Lymphs Abs: 1.2 10*3/uL (ref 0.7–4.0)
MCH: 26.4 pg (ref 26.0–34.0)
MCHC: 30.2 g/dL (ref 30.0–36.0)
MCV: 87.4 fL (ref 78.0–100.0)
Monocytes Absolute: 1.7 10*3/uL — ABNORMAL HIGH (ref 0.1–1.0)
Monocytes Relative: 13 % — ABNORMAL HIGH (ref 3–12)
Neutro Abs: 10 10*3/uL — ABNORMAL HIGH (ref 1.7–7.7)
Neutrophils Relative %: 78 % — ABNORMAL HIGH (ref 43–77)
PLATELETS: 282 10*3/uL (ref 150–400)
RBC: 4.13 MIL/uL (ref 3.87–5.11)
RDW: 15.7 % — ABNORMAL HIGH (ref 11.5–15.5)
WBC: 12.9 10*3/uL — AB (ref 4.0–10.5)

## 2014-05-25 LAB — PROTIME-INR
INR: 4.71 — ABNORMAL HIGH (ref 0.00–1.49)
Prothrombin Time: 42.4 seconds — ABNORMAL HIGH (ref 11.6–15.2)

## 2014-05-25 LAB — LEGIONELLA ANTIGEN, URINE: LEGIONELLA ANTIGEN, URINE: NEGATIVE

## 2014-05-25 LAB — GLUCOSE, CAPILLARY
GLUCOSE-CAPILLARY: 114 mg/dL — AB (ref 70–99)
GLUCOSE-CAPILLARY: 245 mg/dL — AB (ref 70–99)
Glucose-Capillary: 123 mg/dL — ABNORMAL HIGH (ref 70–99)
Glucose-Capillary: 157 mg/dL — ABNORMAL HIGH (ref 70–99)

## 2014-05-25 NOTE — Progress Notes (Signed)
Echo Lab  2D Echocardiogram completed.  Soua Lenk L Tiandre Teall, RDCS 05/25/2014 11:29 AM

## 2014-05-25 NOTE — Progress Notes (Signed)
Placed pt. On bipap. Pt. Breathing was starting to become labored. RT will continue to monitor.

## 2014-05-25 NOTE — Progress Notes (Signed)
PROGRESS NOTE  Marissa SearingJulia H Lazard WUJ:[id number] DOB: 01/28/1917 DOA: 05/24/2014 PCP: Sissy HoffSWAYNE,DAVID W, MD  [name] is a 80 y.o. female  With a h/o chronic A fib on coumadin, diastolic HF, chronic dyspnea presents to ED with dyspnea and chest pressure. sats reportedly in the 50's in the field. Now with sats in the 90s on bipap. Has been weak, poor appetite, occasional loose stool, diaphoresis and subjective fevers at home. Lives alone. In ED, found to have respiratory acidosis, CXR showing CHF, possible pneumonia, leukocytosis, proBNP 3000. No CP currently. Feels slightly better on bipap.  Assessment/Plan: Acute respiratory failure secondary to CHF and pneumonia: SDU. Taken off Bipap overnight  supratherapeutic coumadin: hold.   Atrial fibrillation, chronic: see below   CAP (community acquired pneumonia): continue rocephin, azithro. Blood cultures and urine legionella, strep   Acute on chronic diastolic heart failure: IV lasix bid. Last echo 2012. Will repeat.   Htn: continue metoprolol and diltiazem as blood pressure tolerates. BP soft, so hold ARB  Leukocytosis- resolved  Code Status: partial Family Communication: daughter at bedside Disposition Plan: leave in SDU for now   Consultants:  none  Procedures:  echo   HPI/Subjective: Patient says she is tired -daughter states patient has been having trouble swallowing- food getting stuck in esophagus   Objective: Filed Vitals:   05/25/14 0735  BP: 127/53  Pulse: 119  Temp: 97.8 F (36.6 C)  Resp: 27    Intake/Output Summary (Last 24 hours) at 05/25/14 0814 Last data filed at 05/25/14 0552  Gross per 24 hour  Intake      0 ml  Output    225 ml  Net   -225 ml   Filed Weights   05/24/14 1524 05/25/14 0441  Weight: 41.8 kg (92 lb 2.4 oz) 40.5 kg (89 lb 4.6 oz)    Exam:   General:  Sleepy, will awaken to stimuli  Cardiovascular: irrr  Respiratory: no wheezing, rales b/l  Abdomen: +BS,  soft  Musculoskeletal: moves all 4  ext , no edema  Data Reviewed: Basic Metabolic Panel:  Recent Labs Lab 05/24/14 1135 05/25/14 0331  NA 136* 138  K 4.2 4.3  CL 93* 94*  CO2 30 35*  GLUCOSE 190* 117*  BUN 19 27*  CREATININE 0.58 0.81  CALCIUM 9.4 9.2   Liver Function Tests:  Recent Labs Lab 05/25/14 0331  AST 21  ALT 24  ALKPHOS 100  BILITOT 0.3  PROT 6.7  ALBUMIN 2.8*   No results found for this basename: LIPASE, AMYLASE,  in the last 168 hours No results found for this basename: AMMONIA,  in the last 168 hours CBC:  Recent Labs Lab 05/24/14 1135 05/25/14 0331  WBC 18.1* 12.9*  NEUTROABS 15.7* 10.0*  HGB 12.1 10.9*  HCT 39.0 36.1  MCV 88.0 87.4  PLT 316 282   Cardiac Enzymes: No results found for this basename: CKTOTAL, CKMB, CKMBINDEX, TROPONINI,  in the last 168 hours BNP (last 3 results)  Recent Labs  05/24/14 1135  PROBNP 3352.0*   CBG:  Recent Labs Lab 05/24/14 2142 05/25/14 0733  GLUCAP 135* 114*    Recent Results (from the past 240 hour(s))  MRSA PCR SCREENING     Status: None   Collection Time    05/24/14  4:56 PM      Result Value Ref Range Status   MRSA by PCR NEGATIVE  NEGATIVE Final   Comment:  The GeneXpert MRSA Assay (FDA     approved for NASAL specimens     only), is one component of a     comprehensive MRSA colonization     surveillance program. It is not     intended to diagnose MRSA     infection nor to guide or     monitor treatment for     MRSA infections.     Studies: Dg Chest Port 1 View  05/24/2014   CLINICAL DATA:  Shortness of breath.  EXAM: PORTABLE CHEST - 1 VIEW  COMPARISON:  Chest x-ray 05/03/2014.  FINDINGS: Cardiomegaly with pulmonary vascular prominence and bilateral interstitial prominence with bilateral pleural effusions are present consistent with congestive heart failure. Basilar alveolar infiltrates and/or atelectasis present. Basilar pneumonia cannot be excluded.  IMPRESSION: 1.  Congestive heart failure with pulmonary edema and bilateral effusions . 2. Bibasilar pneumonia cannot be excluded.   Electronically Signed   By: Maisie Fushomas  Register   On: 05/24/2014 12:29    Scheduled Meds: . antiseptic oral rinse  15 mL Mouth Rinse q12n4p  . azithromycin  500 mg Intravenous Q24H  . cefTRIAXone (ROCEPHIN)  IV  1 g Intravenous Q24H  . chlorhexidine  15 mL Mouth Rinse BID  . diltiazem  120 mg Oral BID  . furosemide  40 mg Intravenous BID  . metoprolol succinate  25 mg Oral Daily  . Warfarin - Pharmacist Dosing Inpatient   Does not apply q1800   Continuous Infusions:  Antibiotics Given (last 72 hours)   None      Principal Problem:   Acute respiratory failure Active Problems:   Atrial fibrillation   CAP (community acquired pneumonia)   Acute on chronic diastolic heart failure    Time spent: 35 min    VANN, JESSICA  [facility] Pager [phone]. If 7PM-7AM, please contact night-coverage at [email or web address], password [email or web address] 05/25/2014, 8:14 AM  LOS: 1 day

## 2014-05-25 NOTE — Progress Notes (Signed)
ANTICOAGULATION CONSULT NOTE - Follow up Consult  Pharmacy Consult for coumadin Indication: atrial fibrillation  Allergies  Allergen Reactions  . Codeine Other (See Comments)    Reaction unknown  . Sulfa Antibiotics Other (See Comments)    Reaction unknown    Patient Measurements: Height: 4\' 11"  (149.9 cm) Weight: 89 lb 4.6 oz (40.5 kg) IBW/kg (Calculated) : 43.2   Vital Signs: Temp: 97.8 F (36.6 C) (06/13 0735) Temp src: Oral (06/13 0735) BP: 127/53 mmHg (06/13 0735) Pulse Rate: 119 (06/13 0735)  Labs:  Recent Labs  05/24/14 1135 05/25/14 0331  HGB 12.1 10.9*  HCT 39.0 36.1  PLT 316 282  LABPROT 41.8* 42.4*  INR 4.62* 4.71*  CREATININE 0.58 0.81    Estimated Creatinine Clearance: 27.2 ml/min (by C-G formula based on Cr of 0.81).   Medical History: Past Medical History  Diagnosis Date  . HTN (hypertension)   . Arthritis, hip     , Left  . Insomnia   . Celiac disease   . Headaches, cluster   . Atrial fibrillation   . Complication of anesthesia     difficult waking  . Dysrhythmia     hx of atrial fibrilation  . Shortness of breath   . Anxiety   . Anemia   . Celiac disease     Medications:  Prescriptions prior to admission  Medication Sig Dispense Refill  . acetaminophen (TYLENOL) 500 MG tablet Take 500 mg by mouth every 6 (six) hours as needed for mild pain.       Marland Kitchen. diltiazem (CARDIZEM CD) 120 MG 24 hr capsule Take 1 capsule by mouth 2 (two) times daily.      . ferrous sulfate 325 (65 FE) MG tablet Take 325 mg by mouth daily with breakfast.      . furosemide (LASIX) 40 MG tablet Take 40 mg by mouth daily.      Marland Kitchen. losartan (COZAAR) 50 MG tablet Take 25 mg by mouth daily.       . metoprolol succinate (TOPROL-XL) 100 MG 24 hr tablet Take 150 mg by mouth daily. Take with or immediately following a meal.      . Multiple Vitamin (MULTIVITAMIN) tablet Take 1 tablet by mouth daily.      Marland Kitchen. warfarin (COUMADIN) 1 MG tablet Take 1.5 mg by mouth daily.      .  vitamin E 400 UNIT capsule Take 400 Units by mouth daily.        Assessment: [age] yoF admitted 6/12 with CP with possible PNA and noted on coumadin at home for afib. Pharmacy has been consulted to dose coumadin, INR today = 4.62>4.71 (last INR as outpatient was 2.0 05/09/14).   Home coumadin dose: 1.5mg /day (last dose 05/23/14)  CBC wnl, plts wnl. No bleeding noted.   Goal of Therapy:  INR 2-3 Monitor platelets by anticoagulation protocol: Yes   Plan:  -Hold coumadin tonight -Daily PT/INR  Britt BottomAlvin B. Artelia Larocheung, PharmD Clinical Pharmacist - Resident Phone: [phone] Pager: [phone] 05/25/2014 8:19 AM

## 2014-05-26 ENCOUNTER — Inpatient Hospital Stay (HOSPITAL_COMMUNITY): Payer: Medicare Other

## 2014-05-26 DIAGNOSIS — J90 Pleural effusion, not elsewhere classified: Secondary | ICD-10-CM

## 2014-05-26 DIAGNOSIS — R0902 Hypoxemia: Secondary | ICD-10-CM

## 2014-05-26 LAB — CBC
HEMATOCRIT: 38 % (ref 36.0–46.0)
Hemoglobin: 11.2 g/dL — ABNORMAL LOW (ref 12.0–15.0)
MCH: 26.4 pg (ref 26.0–34.0)
MCHC: 29.5 g/dL — ABNORMAL LOW (ref 30.0–36.0)
MCV: 89.4 fL (ref 78.0–100.0)
Platelets: 339 10*3/uL (ref 150–400)
RBC: 4.25 MIL/uL (ref 3.87–5.11)
RDW: 15.8 % — AB (ref 11.5–15.5)
WBC: 14 10*3/uL — ABNORMAL HIGH (ref 4.0–10.5)

## 2014-05-26 LAB — BASIC METABOLIC PANEL WITH GFR
BUN: 27 mg/dL — ABNORMAL HIGH (ref 6–23)
CO2: 36 meq/L — ABNORMAL HIGH (ref 19–32)
Calcium: 9.2 mg/dL (ref 8.4–10.5)
Chloride: 91 meq/L — ABNORMAL LOW (ref 96–112)
Creatinine, Ser: 0.59 mg/dL (ref 0.50–1.10)
GFR calc Af Amer: 88 mL/min — ABNORMAL LOW
GFR calc non Af Amer: 76 mL/min — ABNORMAL LOW
Glucose, Bld: 109 mg/dL — ABNORMAL HIGH (ref 70–99)
Potassium: 4.7 meq/L (ref 3.7–5.3)
Sodium: 137 meq/L (ref 137–147)

## 2014-05-26 LAB — PROTIME-INR
INR: 3.65 — ABNORMAL HIGH (ref 0.00–1.49)
Prothrombin Time: 34.9 s — ABNORMAL HIGH (ref 11.6–15.2)

## 2014-05-26 LAB — GLUCOSE, CAPILLARY: GLUCOSE-CAPILLARY: 120 mg/dL — AB (ref 70–99)

## 2014-05-26 NOTE — Progress Notes (Signed)
Placed pt on bipap to reduce WOB. Pt. Is tolerating well at this time.

## 2014-05-26 NOTE — Progress Notes (Signed)
SLP Cancellation Note  Patient Details Name: [name] MRN: [id number] DOB: 04-23-1917   Cancelled treatment:  ST to complete BSE 05/27/14. Moreen FowlerKaren Rudell Marlowe MS, CCC-SLP [phone] [facility] - [facility] AreaDANKOF,Jeffre Enriques 05/26/2014, 5:43 PM

## 2014-05-26 NOTE — Progress Notes (Addendum)
PROGRESS NOTE  [name] ZOX:[id number] DOB: 03-Feb-1917 DOA: 05/24/2014 PCP: Sissy HoffSWAYNE,DAVID W, MD  [name] is a 80 y.o. female who lives on her own, pays her own bills but does not drive.   With a h/o chronic A fib on coumadin, diastolic HF, chronic dyspnea presents to ED with dyspnea and chest pressure. sats reportedly in the 50's in the field. Now with sats in the 90s on bipap. Has been weak, poor appetite, occasional loose stool, diaphoresis and subjective fevers at home. Lives alone. In ED, found to have respiratory acidosis, CXR showing CHF, possible pneumonia, leukocytosis, proBNP 3000. No CP currently. Feels slightly better on bipap.  Assessment/Plan: Acute respiratory failure secondary to CHF and pneumonia: SDU. bipap restarted overnight Large pleural effusion- pulm consult- IR to be consulted as well- appreciate Dr. Delton Coombesbyrum -PRN morphine  supratherapeutic coumadin: holding- trending down  Atrial fibrillation, chronic: rate controlled   CAP (community acquired pneumonia): continue rocephin, azithro. Blood cultures- NGTD;  urine legionella neg, strep neg   Acute on chronic diastolic heart failure: IV lasix bid. Echo:  There is a large pleural effusion  Htn: continue metoprolol and diltiazem as blood pressure tolerates  Dysphagia -DYS diet and SLP eval- ? Need for barium swallow  Leukocytosis- abx   Code Status: partial Family Communication: daughter at bedside Disposition Plan: leave in SDU for now   Consultants:  none  Procedures: Echo: Left ventricle: The cavity size was normal. Wall thickness was increased in a pattern of mild LVH. The estimated ejection fraction was 65%. Wall motion was normal; there were no regional wall motion abnormalities. - Mitral valve: Mildly calcified annulus. - Left atrium: The atrium was moderately dilated. - Right ventricle: The cavity size was mildly dilated. Systolic function was mildly reduced. - Right atrium: The  atrium was mildly dilated. - Tricuspid valve: There was moderate regurgitation. - Impressions: There is a large pleural effusion.  Impressions:  - There is a large pleural effusion    HPI/Subjective: Slept better C/o some SOB No fever, no chills   Objective: Filed Vitals:   05/26/14 0423  BP: 135/54  Pulse: 81  Temp: 98 F (36.7 C)  Resp: 24    Intake/Output Summary (Last 24 hours) at 05/26/14 0737 Last data filed at 05/26/14 0000  Gross per 24 hour  Intake    360 ml  Output   1075 ml  Net   -715 ml   Filed Weights   05/24/14 1524 05/25/14 0441  Weight: 41.8 kg (92 lb 2.4 oz) 40.5 kg (89 lb 4.6 oz)    Exam:   General:  Answers all my questions,   Cardiovascular: irrr  Respiratory: no wheezing, decreased at bases  Abdomen: +BS, soft  Musculoskeletal: moves all 4  ext , no edema  Data Reviewed: Basic Metabolic Panel:  Recent Labs Lab 05/24/14 1135 05/25/14 0331 05/26/14 0335  NA 136* 138 137  K 4.2 4.3 4.7  CL 93* 94* 91*  CO2 30 35* 36*  GLUCOSE 190* 117* 109*  BUN 19 27* 27*  CREATININE 0.58 0.81 0.59  CALCIUM 9.4 9.2 9.2   Liver Function Tests:  Recent Labs Lab 05/25/14 0331  AST 21  ALT 24  ALKPHOS 100  BILITOT 0.3  PROT 6.7  ALBUMIN 2.8*   No results found for this basename: LIPASE, AMYLASE,  in the last 168 hours No results found for this basename: AMMONIA,  in the last 168 hours CBC:  Recent Labs Lab 05/24/14 1135 05/25/14  [id number] 05/26/14 0335  WBC 18.1* 12.9* 14.0*  NEUTROABS 15.7* 10.0*  --   HGB 12.1 10.9* 11.2*  HCT 39.0 36.1 38.0  MCV 88.0 87.4 89.4  PLT 316 282 339   Cardiac Enzymes: No results found for this basename: CKTOTAL, CKMB, CKMBINDEX, TROPONINI,  in the last 168 hours BNP (last 3 results)  Recent Labs  05/24/14 1135  PROBNP 3352.0*   CBG:  Recent Labs Lab 05/24/14 2142 05/25/14 0733 05/25/14 1156 05/25/14 1643 05/25/14 2124  GLUCAP 135* 114* 123* 157* 245*    Recent Results (from  the past 240 hour(s))  CULTURE, BLOOD (ROUTINE X 2)     Status: None   Collection Time    05/24/14  1:22 PM      Result Value Ref Range Status   Specimen Description BLOOD RIGHT ANTECUBITAL   Final   Special Requests BOTTLES DRAWN AEROBIC AND ANAEROBIC 5CC   Final   Culture  Setup Time     Final   Value: 05/24/2014 16:47     [facility]   Culture     Final   Value:        BLOOD CULTURE RECEIVED NO GROWTH TO DATE CULTURE WILL BE HELD FOR 5 DAYS BEFORE ISSUING A FINAL NEGATIVE REPORT     [facility]formed at [facility]   Report Status PENDING   Incomplete  CULTURE, BLOOD (ROUTINE X 2)     Status: None   Collection Time    05/24/14  1:32 PM      Result Value Ref Range Status   Specimen Description BLOOD RIGHT FOREARM   Final   Special Requests BOTTLES DRAWN AEROBIC ONLY 3CC   Final   Culture  Setup Time     Final   Value: 05/24/2014 16:48     [facility]formed at [facility]   Culture     Final   Value:        BLOOD CULTURE RECEIVED NO GROWTH TO DATE CULTURE WILL BE HELD FOR 5 DAYS BEFORE ISSUING A FINAL NEGATIVE REPORT     [facility]formed at [facility]   Report Status PENDING   Incomplete  MRSA PCR SCREENING     Status: None   Collection Time    05/24/14  4:56 PM      Result Value Ref Range Status   MRSA by PCR NEGATIVE  NEGATIVE Final   Comment:            The GeneXpert MRSA Assay (FDA     approved for NASAL specimens     only), is one component of a     comprehensive MRSA colonization     surveillance program. It is not     intended to diagnose MRSA     infection nor to guide or     monitor treatment for     MRSA infections.     Studies: Dg Chest Port 1 View  05/24/2014   CLINICAL DATA:  Shortness of breath.  EXAM: PORTABLE CHEST - 1 VIEW  COMPARISON:  Chest x-ray 05/03/2014.  FINDINGS: Cardiomegaly with pulmonary vascular prominence and bilateral interstitial prominence with bilateral pleural effusions are present consistent with congestive  heart failure. Basilar alveolar infiltrates and/or atelectasis present. Basilar pneumonia cannot be excluded.  IMPRESSION: 1. Congestive heart failure with pulmonary edema and bilateral effusions . 2. Bibasilar pneumonia cannot be excluded.   Electronically Signed   By: Maisie Fushomas  Register   On: 05/24/2014 12:29  Scheduled Meds: . antiseptic oral rinse  15 mL Mouth Rinse q12n4p  . azithromycin  500 mg Intravenous Q24H  . cefTRIAXone (ROCEPHIN)  IV  1 g Intravenous Q24H  . chlorhexidine  15 mL Mouth Rinse BID  . diltiazem  120 mg Oral BID  . furosemide  40 mg Intravenous BID  . metoprolol succinate  25 mg Oral Daily  . Warfarin - Pharmacist Dosing Inpatient   Does not apply q1800   Continuous Infusions:  Antibiotics Given (last 72 hours)   Date/Time Action Medication Dose Rate   05/25/14 1210 Given   cefTRIAXone (ROCEPHIN) 1 g in dextrose 5 % 50 mL IVPB 1 g 100 mL/hr   05/25/14 1450 Given   azithromycin (ZITHROMAX) 500 mg in dextrose 5 % 250 mL IVPB 500 mg 250 mL/hr      Principal Problem:   Acute respiratory failure Active Problems:   Atrial fibrillation   CAP (community acquired pneumonia)   Acute on chronic diastolic heart failure    Time spent: 35 min    Remmie Bembenek  [facility] Pager [phone]. If 7PM-7AM, please contact night-coverage at [email or web address], password [facility] 05/26/2014, 7:37 AM  LOS: 2 days

## 2014-05-26 NOTE — Progress Notes (Signed)
ANTICOAGULATION CONSULT NOTE - Follow up Consult  Pharmacy Consult for coumadin Indication: atrial fibrillation  Allergies  Allergen Reactions  . Codeine Other (See Comments)    Reaction unknown  . Sulfa Antibiotics Other (See Comments)    Reaction unknown    Patient Measurements: Height: 4\' 11"  (149.9 cm) Weight: 89 lb 4.6 oz (40.5 kg) IBW/kg (Calculated) : 43.2   Vital Signs: Temp: 97.4 F (36.3 C) (06/14 0752) Temp src: Axillary (06/14 0752) BP: 120/61 mmHg (06/14 0752) Pulse Rate: 89 (06/14 0752)  Labs:  Recent Labs  05/24/14 1135 05/25/14 0331 05/26/14 0335  HGB 12.1 10.9* 11.2*  HCT 39.0 36.1 38.0  PLT 316 282 339  LABPROT 41.8* 42.4* 34.9*  INR 4.62* 4.71* 3.65*  CREATININE 0.58 0.81 0.59    Estimated Creatinine Clearance: 27.5 ml/min (by C-G formula based on Cr of 0.59).   Medical History: Past Medical History  Diagnosis Date  . HTN (hypertension)   . Arthritis, hip     , Left  . Insomnia   . Celiac disease   . Headaches, cluster   . Atrial fibrillation   . Complication of anesthesia     difficult waking  . Dysrhythmia     hx of atrial fibrilation  . Shortness of breath   . Anxiety   . Anemia   . Celiac disease     Medications:  Prescriptions prior to admission  Medication Sig Dispense Refill  . acetaminophen (TYLENOL) 500 MG tablet Take 500 mg by mouth every 6 (six) hours as needed for mild pain.       Marland Kitchen. diltiazem (CARDIZEM CD) 120 MG 24 hr capsule Take 1 capsule by mouth 2 (two) times daily.      . ferrous sulfate 325 (65 FE) MG tablet Take 325 mg by mouth daily with breakfast.      . furosemide (LASIX) 40 MG tablet Take 40 mg by mouth daily.      Marland Kitchen. losartan (COZAAR) 50 MG tablet Take 25 mg by mouth daily.       . metoprolol succinate (TOPROL-XL) 100 MG 24 hr tablet Take 150 mg by mouth daily. Take with or immediately following a meal.      . Multiple Vitamin (MULTIVITAMIN) tablet Take 1 tablet by mouth daily.      Marland Kitchen. warfarin  (COUMADIN) 1 MG tablet Take 1.5 mg by mouth daily.      . vitamin E 400 UNIT capsule Take 400 Units by mouth daily.        Assessment: [age] yoF admitted 6/12 with CP with possible PNA and noted on coumadin at home for afib. Pharmacy has been consulted to dose coumadin, INR 6/12= 4.62 (last INR as outpatient was 2.0 05/09/14).   Home coumadin dose: 1.5mg /day (last dose 05/23/14). INR today 3.65  CBC wnl, plts wnl. No bleeding noted.   Goal of Therapy:  INR 2-3 Monitor platelets by anticoagulation protocol: Yes   Plan:  INR still supratherapeutic but trending down -Hold coumadin tonight -Daily PT/INR  Britt BottomAlvin B. Artelia Larocheung, PharmD Clinical Pharmacist - Resident Phone: [phone] Pager: [phone] 05/26/2014 8:58 AM

## 2014-05-27 ENCOUNTER — Inpatient Hospital Stay (HOSPITAL_COMMUNITY): Payer: Medicare Other

## 2014-05-27 DIAGNOSIS — Z515 Encounter for palliative care: Secondary | ICD-10-CM

## 2014-05-27 LAB — CBC
HCT: 38.7 % (ref 36.0–46.0)
Hemoglobin: 11.5 g/dL — ABNORMAL LOW (ref 12.0–15.0)
MCH: 26.8 pg (ref 26.0–34.0)
MCHC: 29.7 g/dL — AB (ref 30.0–36.0)
MCV: 90.2 fL (ref 78.0–100.0)
Platelets: 341 10*3/uL (ref 150–400)
RBC: 4.29 MIL/uL (ref 3.87–5.11)
RDW: 15.3 % (ref 11.5–15.5)
WBC: 13.4 10*3/uL — ABNORMAL HIGH (ref 4.0–10.5)

## 2014-05-27 LAB — GLUCOSE, CAPILLARY
GLUCOSE-CAPILLARY: 115 mg/dL — AB (ref 70–99)
GLUCOSE-CAPILLARY: 144 mg/dL — AB (ref 70–99)
Glucose-Capillary: 116 mg/dL — ABNORMAL HIGH (ref 70–99)
Glucose-Capillary: 139 mg/dL — ABNORMAL HIGH (ref 70–99)
Glucose-Capillary: 117 mg/dL — ABNORMAL HIGH (ref 70–99)
Glucose-Capillary: 210 mg/dL — ABNORMAL HIGH (ref 70–99)

## 2014-05-27 LAB — BASIC METABOLIC PANEL
BUN: 19 mg/dL (ref 6–23)
CO2: 43 mEq/L (ref 19–32)
Calcium: 9.3 mg/dL (ref 8.4–10.5)
Chloride: 91 mEq/L — ABNORMAL LOW (ref 96–112)
Creatinine, Ser: 0.46 mg/dL — ABNORMAL LOW (ref 0.50–1.10)
GFR calc Af Amer: >90 mL/min (ref >90)
GFR, EST NON AFRICAN AMERICAN: 83 mL/min — AB (ref >90)
Glucose, Bld: 136 mg/dL — ABNORMAL HIGH (ref 70–99)
Potassium: 4.8 mEq/L (ref 3.7–5.3)
SODIUM: 137 meq/L (ref 137–147)

## 2014-05-27 LAB — PROTIME-INR
INR: 2.73 — AB (ref 0.00–1.49)
PROTHROMBIN TIME: 28 s — AB (ref 11.6–15.2)

## 2014-05-27 MED ORDER — FUROSEMIDE 10 MG/ML IJ SOLN
40.0000 mg | Freq: Two times a day (BID) | INTRAMUSCULAR | Status: DC
Start: 2014-05-27 — End: 2014-05-28
  Administered 2014-05-27 – 2014-05-28 (×2): 40 mg via INTRAVENOUS
  Filled 2014-05-27 (×2): qty 4

## 2014-05-27 MED ORDER — FUROSEMIDE 10 MG/ML IJ SOLN: 40.0000 mg | Freq: Every day | INTRAMUSCULAR | Status: DC

## 2014-05-27 MED ORDER — WARFARIN SODIUM 1 MG PO TABS
1.5000 mg | ORAL_TABLET | Freq: Once | ORAL | Status: AC
  Administered 2014-05-27: 1.5 mg via ORAL
  Filled 2014-05-27: qty 1

## 2014-05-27 NOTE — Progress Notes (Addendum)
PROGRESS NOTE  [name] ZOX:[id number] DOB: [birth year] DOA: 05/24/2014 PCP: Sissy HoffSWAYNE,DAVID W, MD  Marissa SearingJulia H Buck is a 80 y.o. female who lives on her own, pays her own bills but does not drive.   With a h/o chronic A fib on coumadin, diastolic HF, chronic dyspnea presents to ED with dyspnea and chest pressure. sats reportedly in the 50's in the field. Now with sats in the 90s on bipap. Has been weak, poor appetite, occasional loose stool, diaphoresis and subjective fevers at home. Lives alone. In ED, found to have respiratory acidosis, CXR showing CHF, possible pneumonia, leukocytosis, proBNP 3000. No CP currently. Feels slightly better on bipap.  Assessment/Plan: Acute respiratory failure secondary to pleural effusions and pneumonia: SDU. bipap restarted overnight- patient does not tolerate well -per family, patient having breathing troubles at home as well Large pleural effusion- pulm consult- IR to be consulted as well- appreciate Dr. Delton Coombesbyrum- spoke with family about the benefits of thoracentesis as diagnostic -will get palliative care consult -PRN morphine  CO2 retention -wean off O2 to keep sats 90-92%  supratherapeutic coumadin: holding- trending down  Atrial fibrillation, chronic: rate controlled   CAP (community acquired pneumonia): continue rocephin, azithro. Blood cultures- NGTD;  urine legionella neg, strep neg   Acute on chronic diastolic heart failure: IV lasix- change to daily. Echo:  There is a large pleural effusion  Htn: continue metoprolol and diltiazem as blood pressure tolerates  Dysphagia -DYS diet and SLP eval- ? Need for barium swallow  Leukocytosis- abx   Code Status: partial Family Communication: daughters at bedside Disposition Plan: leave in SDU for now   Consultants:  none  Procedures: Echo: Left ventricle: The cavity size was normal. Wall thickness was increased in a pattern of mild LVH. The estimated ejection fraction was 65%. Wall motion  was normal; there were no regional wall motion abnormalities. - Mitral valve: Mildly calcified annulus. - Left atrium: The atrium was moderately dilated. - Right ventricle: The cavity size was mildly dilated. Systolic function was mildly reduced. - Right atrium: The atrium was mildly dilated. - Tricuspid valve: There was moderate regurgitation. - Impressions: There is a large pleural effusion.  Impressions:  - There is a large pleural effusion    HPI/Subjective: Anxious about procedure No CP, no fevers   Objective: Filed Vitals:   05/27/14 0600  BP: 140/71  Pulse: 86  Temp:   Resp: 26    Intake/Output Summary (Last 24 hours) at 05/27/14 0814 Last data filed at 05/27/14 0710  Gross per 24 hour  Intake      0 ml  Output   1100 ml  Net  -1100 ml   Filed Weights   05/24/14 1524 05/25/14 0441 05/27/14 0500  Weight: 41.8 kg (92 lb 2.4 oz) 40.5 kg (89 lb 4.6 oz) 40.053 kg (88 lb 4.8 oz)    Exam:   General:  Answers all my questions,   Cardiovascular: irrr  Respiratory: no wheezing, decreased at bases L>R  Abdomen: +BS, soft  Musculoskeletal: moves all 4  ext , no edema  Data Reviewed: Basic Metabolic Panel:  Recent Labs Lab 05/24/14 1135 05/25/14 0331 05/26/14 0335 05/27/14 0425  NA 136* 138 137 137  K 4.2 4.3 4.7 4.8  CL 93* 94* 91* 91*  CO2 30 35* 36* 43*  GLUCOSE 190* 117* 109* 136*  BUN 19 27* 27* 19  CREATININE 0.58 0.81 0.59 0.46*  CALCIUM 9.4 9.2 9.2 9.3   Liver Function Tests:  Recent Labs  Lab 05/25/14 0331  AST 21  ALT 24  ALKPHOS 100  BILITOT 0.3  PROT 6.7  ALBUMIN 2.8*   No results found for this basename: LIPASE, AMYLASE,  in the last 168 hours No results found for this basename: AMMONIA,  in the last 168 hours CBC:  Recent Labs Lab 05/24/14 1135 05/25/14 0331 05/26/14 0335 05/27/14 0425  WBC 18.1* 12.9* 14.0* 13.4*  NEUTROABS 15.7* 10.0*  --   --   HGB 12.1 10.9* 11.2* 11.5*  HCT 39.0 36.1 38.0 38.7  MCV 88.0  87.4 89.4 90.2  PLT 316 282 339 341   Cardiac Enzymes: No results found for this basename: CKTOTAL, CKMB, CKMBINDEX, TROPONINI,  in the last 168 hours BNP (last 3 results)  Recent Labs  05/24/14 1135  PROBNP 3352.0*   CBG:  Recent Labs Lab 05/25/14 0733 05/25/14 1156 05/25/14 1643 05/25/14 2124 05/26/14 1650  GLUCAP 114* 123* 157* 245* 120*    Recent Results (from the past 240 hour(s))  CULTURE, BLOOD (ROUTINE X 2)     Status: None   Collection Time    05/24/14  1:22 PM      Result Value Ref Range Status   Specimen Description BLOOD RIGHT ANTECUBITAL   Final   Special Requests BOTTLES DRAWN AEROBIC AND ANAEROBIC 5CC   Final   Culture  Setup Time     Final   Value: 05/24/2014 16:47     Performed at [facility]   Culture     Final   Value:        BLOOD CULTURE RECEIVED NO GROWTH TO DATE CULTURE WILL BE HELD FOR 5 DAYS BEFORE ISSUING A FINAL NEGATIVE REPORT     Performed at [facility]   Report Status PENDING   Incomplete  CULTURE, BLOOD (ROUTINE X 2)     Status: None   Collection Time    05/24/14  1:32 PM      Result Value Ref Range Status   Specimen Description BLOOD RIGHT FOREARM   Final   Special Requests BOTTLES DRAWN AEROBIC ONLY 3CC   Final   Culture  Setup Time     Final   Value: 05/24/2014 16:48     Performed at [facility]   Culture     Final   Value:        BLOOD CULTURE RECEIVED NO GROWTH TO DATE CULTURE WILL BE HELD FOR 5 DAYS BEFORE ISSUING A FINAL NEGATIVE REPORT     Performed at [facility]   Report Status PENDING   Incomplete  MRSA PCR SCREENING     Status: None   Collection Time    05/24/14  4:56 PM      Result Value Ref Range Status   MRSA by PCR NEGATIVE  NEGATIVE Final   Comment:            The GeneXpert MRSA Assay (FDA     approved for NASAL specimens     only), is one component of a     comprehensive MRSA colonization     surveillance program. It is not     intended to diagnose MRSA     infection  nor to guide or     monitor treatment for     MRSA infections.     Studies: Dg Chest Port 1 View  05/26/2014   CLINICAL DATA:  Shortness of breath.  EXAM: PORTABLE CHEST - 1 VIEW  COMPARISON:  05/24/2014.  FINDINGS: Cardiomegaly  an pulmonary venous congestion with bilateral pulmonary alveolar infiltrates consistent with congestive heart failure and pulmonary edema. Bilateral effusions are noted. These findings are progressive from prior study.  IMPRESSION: Progressive changes of congestive heart failure and pulmonary edema with bilateral pleural effusions.   Electronically Signed   By: Maisie Fushomas  Register   On: 05/26/2014 08:58    Scheduled Meds: . antiseptic oral rinse  15 mL Mouth Rinse q12n4p  . azithromycin  500 mg Intravenous Q24H  . cefTRIAXone (ROCEPHIN)  IV  1 g Intravenous Q24H  . chlorhexidine  15 mL Mouth Rinse BID  . diltiazem  120 mg Oral BID  . [START ON 05/28/2014] furosemide  40 mg Intravenous Daily  . metoprolol succinate  25 mg Oral Daily  . Warfarin - Pharmacist Dosing Inpatient   Does not apply q1800   Continuous Infusions:  Antibiotics Given (last 72 hours)   Date/Time Action Medication Dose Rate   05/25/14 1210 Given   cefTRIAXone (ROCEPHIN) 1 g in dextrose 5 % 50 mL IVPB 1 g 100 mL/hr   05/25/14 1450 Given   azithromycin (ZITHROMAX) 500 mg in dextrose 5 % 250 mL IVPB 500 mg 250 mL/hr   05/26/14 1212 Given   cefTRIAXone (ROCEPHIN) 1 g in dextrose 5 % 50 mL IVPB 1 g 100 mL/hr   05/26/14 1324 Given   azithromycin (ZITHROMAX) 500 mg in dextrose 5 % 250 mL IVPB 500 mg 250 mL/hr      Principal Problem:   Acute respiratory failure Active Problems:   Atrial fibrillation   CAP (community acquired pneumonia)   Acute on chronic diastolic heart failure    Time spent: 35 min    Anyeli Hockenbury  [facility] Pager [phone]. If 7PM-7AM, please contact night-coverage at [email or web address], password [email or web address] 05/27/2014, 8:14 AM  LOS: 3 days

## 2014-05-27 NOTE — Consult Note (Signed)
I have reviewed this case with our NP and agree with the Assessment and Plan as stated.  Belinda Schlichting L. Chadd Tollison, MD MBA The Palliative Medicine Team at [facility] Team Phone: [phone] Pager: [phone]   

## 2014-05-27 NOTE — Progress Notes (Signed)
SLP Cancellation Note  Patient Details Name: Rozell SearingJulia H Faddis MRN: [id number] DOB: August 05, 1917   Cancelled treatment:        MD reported pt. May be having a thorocentesis and to defer swallow assessment until tomorrow.  MD stated family/pt. (?) is reporting globus sensation with food mid sternum.  Will assess next date.   Breck CoonsLisa Willis Grandview PlazaLitaker M.Ed ITT IndustriesCCC-SLP Pager [phone]  05/27/2014

## 2014-05-27 NOTE — Progress Notes (Signed)
ANTICOAGULATION CONSULT NOTE - Follow Up Consult  Pharmacy Consult for Coumadin Indication: atrial fibrillation  Allergies  Allergen Reactions  . Codeine Other (See Comments)    Reaction unknown  . Sulfa Antibiotics Other (See Comments)    Reaction unknown    Patient Measurements: Height: 4\' 11"  (149.9 cm) Weight: 88 lb 4.8 oz (40.053 kg) IBW/kg (Calculated) : 43.2 Heparin Dosing Weight:   Vital Signs: Temp: 98.4 F (36.9 C) (06/15 0715) Temp src: Oral (06/15 0715) BP: 139/79 mmHg (06/15 0800) Pulse Rate: 106 (06/15 0800)  Labs:  Recent Labs  05/25/14 0331 05/26/14 0335 05/27/14 0425  HGB 10.9* 11.2* 11.5*  HCT 36.1 38.0 38.7  PLT 282 339 341  LABPROT 42.4* 34.9* 28.0*  INR 4.71* 3.65* 2.73*  CREATININE 0.81 0.59 0.46*    Estimated Creatinine Clearance: 27.2 ml/min (by C-G formula based on Cr of 0.46).   Assessment: [name] admitted 6/12 with CP with possible PNA. Coumadin PTA for afib. INR 6/12= 4.62 (last INR as outpatient was 2.0 05/09/14). CXR shows CHF with pulm edema and bilateral effusions. Bibasilar PNA cannot be excluded   PMH: CHF, HTN, insomnia, afib, celiac disease  Anticoagulation: Coumadin PTA 1.5mg  daily (last dose 05/23/14). INR 2.73 down. CBC stable.  Infectious Disease: PNA and large pleural effusion, WBC 13.4. Afebrile.  6/12 CTX>> 6/12 Azithro>>  6/12 BCx>>  Cardiovascular: CHF (EF 65%), hx HTN, VSS but HR 106, - Toprol, cardizem, furosemide IV.  Endocrinology: Glucose 136  Gastrointestinal / Nutrition: heart diet  Nephrology: No past hx kidney disease, SCr 0.46, CrCl 27  Pulmonary: 92% at 4L  Hematology / Oncology: CBC wnl, plts wnl  PTA Medication Issues: ferrous sulfate, vitamin E, losartan  Goal of Therapy:  INR 2-3 Monitor platelets by anticoagulation protocol: Yes   Plan:  Possible thoracentesis Thuesday Coumadin 1.5mg  po x 1 today Daily INR  Derron Pipkins S. Merilynn Finlandobertson, PharmD, BCPS Clinical Staff Pharmacist Pager  [phone]  Misty Stanleyobertson, Laymon Stockert Stillinger 05/27/2014,9:14 AM

## 2014-05-27 NOTE — Progress Notes (Signed)
Family questioned why Coumadin 1.5mg  was given when INR was 2.73 and Pt was destined to go for thoracentesis 6/16 in a more therapeutic range. RN checked with pharmacist who stated with current downward INR trend, pt might go too low and therefore he gave 1.5mg  coumadin 6/15.

## 2014-05-27 NOTE — Progress Notes (Signed)
Patient ID: Marissa SearingJulia H Buck, female   DOB: 1917-09-16, 80 y.o.   MRN: [id number]   Pt tentatively scheduled for thoracentesis today INR now 2.73 02 sat: 90-96% on 5 Liters Pt able to speak but with noted breathing difficulty.  Have come to room to answer questions of pt and dtrs. Answered all to satisfaction Encouraged thoracentesis for symptom relief.  Pt wants to wait one more day. She will plan for possibly tomorrow.  I will recheck with pt in am

## 2014-05-27 NOTE — Consult Note (Signed)
Patient [name]      DOB: 1917-10-17      YNW:[id number]     Consult Note from the Palliative Medicine Team at [facility]    Consult Requested by: Dr Benjamine Mola     PCP: Sissy Hoff, MD Reason for Consultation:Clarification of GOC and options     Phone Number:[phone]  Assessment of patients Current state  Noted slow physical, functional decline per patient and family since January of 2015. Admitted thru ER with increased SOB,  Chest xray: IMPRESSION:  Progressive changes of congestive heart failure and pulmonary edema  with bilateral pleural effusions. IV antibiotics started, recommended thoracentesis (patient is considering), medically unstable. Faced with advanced directive decisions and anticipatory care needs   Consult is for review of medical treatment options, clarification of goals of care and end of life issues, disposition and options, and symptom recommendation.  This NP Lorinda Creed reviewed medical records, received report from team, assessed the patient and then meet at the patient's bedside along with her family to include her daughters ,Clayborne Dana and Pattricia Boss  to discuss diagnosis prognosis, GOC, EOL wishes disposition and options.  A detailed discussion was had today regarding advanced directives.  Concepts specific to code status, artifical feeding and hydration, continued IV antibiotics and rehospitalization was had.  The difference between a aggressive medical intervention path  and a palliative comfort care path for this patient at this time was had.  Values and goals of care important to patient and family were attempted to be elicited.  Concept of Hospice and Palliative Care were discussed  Natural trajectory and expectations at EOL were discussed.  Questions and concerns addressed.  Hard Choices booklet left for review. Family encouraged to call with questions or concerns.  PMT will continue to support holistically.   Goals of Care: 1.  Code  Status:  Partial (drugs only)  -Continue with BiPap a needed   2. Scope of Treatment:  Continue with present medical interventions, patient and family are hopeful for improvement.  Patient has not consented to thoracentesis at this time.  They are in a watchful waiting period and will be able to be more definitive with "alittle time"  3. Disposition:  Dependant on outcomes   4. Symptom Management:   1. Dyspnea: medical management of treatable illness.  Utilize BiPAP as indicated.   5. Psychosocial:  Emotional support offered to patient and her family.  Family is very supportive of th patient's wishes.  They need for her to be the decsion maker and will suport her "whatever way she wishes this to go"    Patient Documents Completed or Given: Document Given Completed  Advanced Directives Pkt    MOST yes   DNR    Gone from My Sight    Hard Choices yes     Brief HPI: [name] is a 80 y.o. female with PMH of  chronic A fib on coumadin, diastolic HF, chronic dyspnea who was admitted thru the  ED with dyspnea and chest pressure. Sats reportedly in the 50's in the field.  In ED, found to have respiratory acidosis, CXR showing CHF, possible pneumonia, leukocytosis, proBNP 3000. Requiring intermittent BiPAP   BMW:UXLKGMWN, fatigue, dyspnea   PMH:  Past Medical History  Diagnosis Date  . HTN (hypertension)   . Arthritis, hip     , Left  . Insomnia   . Celiac disease   . Headaches, cluster   . Atrial fibrillation   . Complication of anesthesia  difficult waking  . Dysrhythmia     hx of atrial fibrilation  . Shortness of breath   . Anxiety   . Anemia   . Celiac disease      PSH: Past Surgical History  Procedure Laterality Date  . Abdominal hysterectomy    . Hip pinning    . Cholecystectomy    . Appendectomy    . Tonsillectomy     I have reviewed the FH and SH and  If appropriate update it with new information. Allergies  Allergen Reactions  . Codeine  Other (See Comments)    Reaction unknown  . Sulfa Antibiotics Other (See Comments)    Reaction unknown   Scheduled Meds: . antiseptic oral rinse  15 mL Mouth Rinse q12n4p  . azithromycin  500 mg Intravenous Q24H  . cefTRIAXone (ROCEPHIN)  IV  1 g Intravenous Q24H  . chlorhexidine  15 mL Mouth Rinse BID  . diltiazem  120 mg Oral BID  . [START ON 05/28/2014] furosemide  40 mg Intravenous Daily  . metoprolol succinate  25 mg Oral Daily  . warfarin  1.5 mg Oral ONCE-1800  . Warfarin - Pharmacist Dosing Inpatient   Does not apply q1800   Continuous Infusions:  PRN Meds:.acetaminophen, albuterol, alum & mag hydroxide-simeth, guaiFENesin-dextromethorphan, morphine injection, nitroGLYCERIN, ondansetron (ZOFRAN) IV, ondansetron, senna-docusate    BP 123/59  Pulse 103  Temp(Src) 98.4 F (36.9 C) (Oral)  Resp 27  Ht 4\' 11"  (1.499 m)  Wt 40.053 kg (88 lb 4.8 oz)  BMI 17.83 kg/m2  SpO2 94%   PPS:  40 % at best   Intake/Output Summary (Last 24 hours) at 05/27/14 1256 Last data filed at 05/27/14 1100  Gross per 24 hour  Intake    120 ml  Output    925 ml  Net   -805 ml    Physical Exam:  General: frail elderly white female HEENT:  moist buccal membranes  Chest:   Diminished in bases, R>L, dyspneic at rest CVS: tachycardic, irregualr Abdomen:soft NT +BS Ext: without edema Neuro: alert and oriented X3  Labs: CBC    Component Value Date/Time   WBC 13.4* 05/27/2014 0425   RBC 4.29 05/27/2014 0425   HGB 11.5* 05/27/2014 0425   HCT 38.7 05/27/2014 0425   PLT 341 05/27/2014 0425   MCV 90.2 05/27/2014 0425   MCH 26.8 05/27/2014 0425   MCHC 29.7* 05/27/2014 0425   RDW 15.3 05/27/2014 0425   LYMPHSABS 1.2 05/25/2014 0331   MONOABS 1.7* 05/25/2014 0331   EOSABS 0.0 05/25/2014 0331   BASOSABS 0.0 05/25/2014 0331    BMET    Component Value Date/Time   NA 137 05/27/2014 0425   K 4.8 05/27/2014 0425   CL 91* 05/27/2014 0425   CO2 43* 05/27/2014 0425   GLUCOSE 136* 05/27/2014 0425   BUN  19 05/27/2014 0425   CREATININE 0.46* 05/27/2014 0425   CALCIUM 9.3 05/27/2014 0425   GFRNONAA 83* 05/27/2014 0425   GFRAA >90 05/27/2014 0425    CMP     Component Value Date/Time   NA 137 05/27/2014 0425   K 4.8 05/27/2014 0425   CL 91* 05/27/2014 0425   CO2 43* 05/27/2014 0425   GLUCOSE 136* 05/27/2014 0425   BUN 19 05/27/2014 0425   CREATININE 0.46* 05/27/2014 0425   CALCIUM 9.3 05/27/2014 0425   PROT 6.7 05/25/2014 0331   ALBUMIN 2.8* 05/25/2014 0331   AST 21 05/25/2014 0331   ALT 24 05/25/2014 0331  ALKPHOS 100 05/25/2014 0331   BILITOT 0.3 05/25/2014 0331   GFRNONAA 83* 05/27/2014 0425   GFRAA >90 05/27/2014 0425     Time In Time Out Total Time Spent with Patient Total Overall Time  1345 1520 70 min 80 min    Greater than 50%  of this time was spent counseling and coordinating care related to the above assessment and plan.   Lorinda CreedMary Larach NP  Palliative Medicine Team Team Phone # [phone] Pager [phone]  Discussed with Dr Benjamine MolaVann

## 2014-05-27 NOTE — Progress Notes (Signed)
CRITICAL VALUE ALERT  Critical value received:  CO2-43  Date of notification:  05/27/14  Time of notification:  0600  Critical value read back: yes  Nurse who received alert:  R.Gracou  MD notified (1st page):  L.Bretta BangEasterwood, NP  Time of first page:  0620  MD notified (2nd page):  Time of second page:  Responding MD:  Waiting on NP to respond. Will report to AM nurse and monitor.  M.Foster SimpsonPiel, RN

## 2014-05-28 ENCOUNTER — Inpatient Hospital Stay (HOSPITAL_COMMUNITY): Payer: Medicare Other

## 2014-05-28 DIAGNOSIS — I10 Essential (primary) hypertension: Secondary | ICD-10-CM

## 2014-05-28 LAB — PROTIME-INR
INR: 2.45 — ABNORMAL HIGH (ref 0.00–1.49)
Prothrombin Time: 25.8 seconds — ABNORMAL HIGH (ref 11.6–15.2)

## 2014-05-28 LAB — BASIC METABOLIC PANEL
BUN: 14 mg/dL (ref 6–23)
CO2: 44 meq/L — AB (ref 19–32)
Calcium: 9 mg/dL (ref 8.4–10.5)
Chloride: 86 mEq/L — ABNORMAL LOW (ref 96–112)
Creatinine, Ser: 0.4 mg/dL — ABNORMAL LOW (ref 0.50–1.10)
GFR calc Af Amer: >90 mL/min (ref >90)
GFR calc non Af Amer: 86 mL/min — ABNORMAL LOW (ref >90)
GLUCOSE: 117 mg/dL — AB (ref 70–99)
Potassium: 4.3 mEq/L (ref 3.7–5.3)
SODIUM: 135 meq/L — AB (ref 137–147)

## 2014-05-28 LAB — LACTATE DEHYDROGENASE, PLEURAL OR PERITONEAL FLUID: LD, Fluid: 85 U/L — ABNORMAL HIGH (ref 3–23)

## 2014-05-28 LAB — BODY FLUID CELL COUNT WITH DIFFERENTIAL
Eos, Fluid: NONE SEEN %
Lymphs, Fluid: 25 %
MONOCYTE-MACROPHAGE-SEROUS FLUID: 30 % — AB (ref 50–90)
Neutrophil Count, Fluid: 45 % — ABNORMAL HIGH (ref 0–25)
WBC FLUID: 2100 uL — AB (ref 0–1000)

## 2014-05-28 LAB — GLUCOSE, CAPILLARY
GLUCOSE-CAPILLARY: 125 mg/dL — AB (ref 70–99)
Glucose-Capillary: 197 mg/dL — ABNORMAL HIGH (ref 70–99)
Glucose-Capillary: 143 mg/dL — ABNORMAL HIGH (ref 70–99)

## 2014-05-28 LAB — CBC
HCT: 36.5 % (ref 36.0–46.0)
HEMOGLOBIN: 10.7 g/dL — AB (ref 12.0–15.0)
MCH: 26.2 pg (ref 26.0–34.0)
MCHC: 29.3 g/dL — ABNORMAL LOW (ref 30.0–36.0)
MCV: 89.5 fL (ref 78.0–100.0)
Platelets: 299 10*3/uL (ref 150–400)
RBC: 4.08 MIL/uL (ref 3.87–5.11)
RDW: 15.1 % (ref 11.5–15.5)
WBC: 12.5 10*3/uL — ABNORMAL HIGH (ref 4.0–10.5)

## 2014-05-28 LAB — TROPONIN I: Troponin I: <0.3 ng/mL (ref <0.30)

## 2014-05-28 LAB — PROTEIN, BODY FLUID: Total protein, fluid: 2.5 g/dL

## 2014-05-28 LAB — GLUCOSE, SEROUS FLUID: Glucose, Fluid: 149 mg/dL

## 2014-05-28 LAB — LACTATE DEHYDROGENASE: LDH: 183 U/L (ref 94–250)

## 2014-05-28 LAB — PROTEIN, TOTAL: TOTAL PROTEIN: 6.7 g/dL (ref 6.0–8.3)

## 2014-05-28 MED ORDER — ALPRAZOLAM 0.5 MG PO TABS
0.5000 mg | ORAL_TABLET | Freq: Three times a day (TID) | ORAL | Status: DC | PRN
  Administered 2014-05-28: 0.5 mg via ORAL
  Filled 2014-05-28: qty 1

## 2014-05-28 MED ORDER — WARFARIN SODIUM 1 MG PO TABS
1.5000 mg | ORAL_TABLET | Freq: Once | ORAL | Status: AC
  Administered 2014-05-28: 1.5 mg via ORAL
  Filled 2014-05-28: qty 1

## 2014-05-28 MED ORDER — FUROSEMIDE 10 MG/ML IJ SOLN
60.0000 mg | Freq: Three times a day (TID) | INTRAMUSCULAR | Status: DC
  Administered 2014-05-28 – 2014-05-29 (×2): 60 mg via INTRAVENOUS
  Filled 2014-05-28 (×4): qty 6

## 2014-05-28 NOTE — Progress Notes (Signed)
ANTICOAGULATION CONSULT NOTE - Follow Up Consult  Pharmacy Consult for Coumadin Indication: atrial fibrillation  Allergies  Allergen Reactions  . Codeine Other (See Comments)    Reaction unknown  . Sulfa Antibiotics Other (See Comments)    Reaction unknown    Patient Measurements: Height: 4\' 11"  (149.9 cm) Weight: 88 lb 2.9 oz (40 kg) IBW/kg (Calculated) : 43.2 Heparin Dosing Weight:   Vital Signs: Temp: 97.4 F (36.3 C) (06/16 0810) Temp src: Axillary (06/16 0810) BP: 134/56 mmHg (06/16 1027) Pulse Rate: 138 (06/16 1026)  Labs:  Recent Labs  05/26/14 0335 05/27/14 0425 05/28/14 0255  HGB 11.2* 11.5* 10.7*  HCT 38.0 38.7 36.5  PLT 339 341 299  LABPROT 34.9* 28.0* 25.8*  INR 3.65* 2.73* 2.45*  CREATININE 0.59 0.46* 0.40*    Estimated Creatinine Clearance: 27.2 ml/min (by C-G formula based on Cr of 0.4).   Assessment: [age] yoF admitted 6/12 with CP with possible PNA. Coumadin PTA for afib. INR 6/12= 4.62 (last INR as outpatient was 2.0 05/09/14). CXR shows CHF with pulm edema and bilateral effusions. Bibasilar PNA cannot be excluded   PMH: CHF, HTN, insomnia, afib, celiac disease  Anticoagulation: Coumadin PTA 1.5mg  daily (last dose 05/23/14). INR 2.45 today. CBC stable.  Going for thoracentesis shortly.   Goal of Therapy:  INR 2-3 Monitor platelets by anticoagulation protocol: Yes   Plan:  1. Will repeat Coumadin 1.5 mg po x 1 tonight. 2. Continue daily PT/INR.  Tad MooreJessica Reanna Scoggin, Pharm D, BCPS  Clinical Pharmacist Pager [phone]  05/28/2014 11:43 AM

## 2014-05-28 NOTE — Progress Notes (Signed)
Pt went to have a thoracentesis a couple of hours ago and just returned to floor. Central telemetry was notified. Pt is resting. Respiratory was with pt and returned with pt. Respiratory took pt off bipap and placed her on Nasal cannula and pt's oxygen saturation is in 90's. Will monitor.

## 2014-05-28 NOTE — Procedures (Signed)
Successful US guided left thoracentesis. Yielded 500 ml of yellow/blood tinged fluid. Pt tolerated procedure well. No immediate complications.  Specimen was sent for labs. CXR ordered.  Pattricia BossMORGAN, KOREEN D PA-C 05/28/2014 4:00 PM

## 2014-05-28 NOTE — Progress Notes (Signed)
Placed pt. On bipap due to labored breathing.

## 2014-05-28 NOTE — Progress Notes (Signed)
Paged and spoke with Dr. Benjamine MolaVann about pt requesting something for anxiety and something to make her sleep. Also notified MD of pt stating that she had chest tightening. Orders given for medication for anxiety,. Gave to pt and pt now sound asleep. Pt's family at bedside.

## 2014-05-28 NOTE — Progress Notes (Signed)
Pt.'s Co2- 44.  Page placed in amion to provider on-call.

## 2014-05-28 NOTE — Evaluation (Signed)
SLP Cancellation Note  Patient Details Name: [name] MRN: [id number] DOB: [birth year]   Cancelled treatment:       Reason Eval/Treat Not Completed: Medical issues which prohibited therapy (Pt on Bipap currently, will reattempt evaluation at later time.  Note palliative care following.  )   Donavan Burnetamara Kimball, MS [facility] SLP [phone]

## 2014-05-28 NOTE — Progress Notes (Signed)
PROGRESS NOTE  [name]:[id number] DOB: 1917/02/13 DOA: 05/24/2014 PCP: Sissy Hoff, MD  [name] is a 80 y.o. female who lives on her own, pays her own bills but does not drive.   With a h/o chronic A fib on coumadin, diastolic HF, chronic dyspnea presents to ED with dyspnea and chest pressure. sats reportedly in the 50's in the field. Now with sats in the 90s on bipap. Has been weak, poor appetite, occasional loose stool, diaphoresis and subjective fevers at home. Lives alone. In ED, found to have respiratory acidosis, CXR showing CHF, possible pneumonia, leukocytosis, proBNP 3000. No CP currently. Feels slightly better on bipap.  Assessment/Plan: Acute respiratory failure secondary to pleural effusions and pneumonia: SDU. bipap restarted overnight- patient does not tolerate well -per family, patient having breathing troubles at home as well Large pleural effusion- pulm consult- IR to be consulted as well- appreciate Dr. Dillon Bjork thoracentesis today -will get palliative care consult -PRN morphine -weight decreasing  CO2 retention -wean off O2 to keep sats 90-92%  supratherapeutic coumadin:  trending down  Atrial fibrillation, chronic: rate controlled   CAP (community acquired pneumonia): continue rocephin, azithro. Blood cultures- NGTD;  urine legionella neg, strep neg   Acute on chronic diastolic heart failure: IV lasix  Echo:  There is a large pleural effusion  Htn: continue metoprolol and diltiazem as blood pressure tolerates  Dysphagia -DYS diet and SLP eval- ? Need for barium swallow  Leukocytosis- abx   Code Status: partial Family Communication: daughters at bedside Disposition Plan: leave in SDU for now   Consultants:  none  Procedures: Echo: Left ventricle: The cavity size was normal. Wall thickness was increased in a pattern of mild LVH. The estimated ejection fraction was 65%. Wall motion was normal; there were no regional wall motion  abnormalities. - Mitral valve: Mildly calcified annulus. - Left atrium: The atrium was moderately dilated. - Right ventricle: The cavity size was mildly dilated. Systolic function was mildly reduced. - Right atrium: The atrium was mildly dilated. - Tricuspid valve: There was moderate regurgitation. - Impressions: There is a large pleural effusion.  Impressions:  - There is a large pleural effusion    HPI/Subjective: Anxious about procedure More awake and talkative today   Objective: Filed Vitals:   05/28/14 1100  BP: 122/67  Pulse: 122  Temp: 97.8 F (36.6 C)  Resp: 33    Intake/Output Summary (Last 24 hours) at 05/28/14 1341 Last data filed at 05/28/14 1300  Gross per 24 hour  Intake   1060 ml  Output   1125 ml  Net    -65 ml   Filed Weights   05/25/14 0441 05/27/14 0500 05/28/14 0406  Weight: 40.5 kg (89 lb 4.6 oz) 40.053 kg (88 lb 4.8 oz) 40 kg (88 lb 2.9 oz)    Exam:   General:  Answers all my questions, more talkative today  Cardiovascular: irrr  Respiratory: no wheezing, decreased at bases L>R  Abdomen: +BS, soft  Musculoskeletal: moves all 4  ext , no edema  Data Reviewed: Basic Metabolic Panel:  Recent Labs Lab 05/24/14 1135 05/25/14 0331 05/26/14 0335 05/27/14 0425 05/28/14 0255  NA 136* 138 137 137 135*  K 4.2 4.3 4.7 4.8 4.3  CL 93* 94* 91* 91* 86*  CO2 30 35* 36* 43* 44*  GLUCOSE 190* 117* 109* 136* 117*  BUN 19 27* 27* 19 14  CREATININE 0.58 0.81 0.59 0.46* 0.40*  CALCIUM 9.4 9.2 9.2 9.3 9.0  Liver Function Tests:  Recent Labs Lab 05/25/14 0331  AST 21  ALT 24  ALKPHOS 100  BILITOT 0.3  PROT 6.7  ALBUMIN 2.8*   No results found for this basename: LIPASE, AMYLASE,  in the last 168 hours No results found for this basename: AMMONIA,  in the last 168 hours CBC:  Recent Labs Lab 05/24/14 1135 05/25/14 0331 05/26/14 0335 05/27/14 0425 05/28/14 0255  WBC 18.1* 12.9* 14.0* 13.4* 12.5*  NEUTROABS 15.7* 10.0*  --    --   --   HGB 12.1 10.9* 11.2* 11.5* 10.7*  HCT 39.0 36.1 38.0 38.7 36.5  MCV 88.0 87.4 89.4 90.2 89.5  PLT 316 282 339 341 299   Cardiac Enzymes: No results found for this basename: CKTOTAL, CKMB, CKMBINDEX, TROPONINI,  in the last 168 hours BNP (last 3 results)  Recent Labs  05/24/14 1135  PROBNP 3352.0*   CBG:  Recent Labs Lab 05/27/14 0803 05/27/14 1158 05/27/14 1605 05/27/14 2136 05/28/14 0742  GLUCAP 117* 115* 210* 144* 125*    Recent Results (from the past 240 hour(s))  CULTURE, BLOOD (ROUTINE X 2)     Status: None   Collection Time    05/24/14  1:22 PM      Result Value Ref Range Status   Specimen Description BLOOD RIGHT ANTECUBITAL   Final   Special Requests BOTTLES DRAWN AEROBIC AND ANAEROBIC 5CC   Final   Culture  Setup Time     Final   Value: 05/24/2014 16:47     Performed at [facility]   Culture     Final   Value:        BLOOD CULTURE RECEIVED NO GROWTH TO DATE CULTURE WILL BE HELD FOR 5 DAYS BEFORE ISSUING A FINAL NEGATIVE REPORT     Performed at [facility]   Report Status PENDING   Incomplete  CULTURE, BLOOD (ROUTINE X 2)     Status: None   Collection Time    05/24/14  1:32 PM      Result Value Ref Range Status   Specimen Description BLOOD RIGHT FOREARM   Final   Special Requests BOTTLES DRAWN AEROBIC ONLY 3CC   Final   Culture  Setup Time     Final   Value: 05/24/2014 16:48     Performed at [facility]   Culture     Final   Value:        BLOOD CULTURE RECEIVED NO GROWTH TO DATE CULTURE WILL BE HELD FOR 5 DAYS BEFORE ISSUING A FINAL NEGATIVE REPORT     Performed at [facility]   Report Status PENDING   Incomplete  MRSA PCR SCREENING     Status: None   Collection Time    05/24/14  4:56 PM      Result Value Ref Range Status   MRSA by PCR NEGATIVE  NEGATIVE Final   Comment:            The GeneXpert MRSA Assay (FDA     approved for NASAL specimens     only), is one component of a     comprehensive MRSA  colonization     surveillance program. It is not     intended to diagnose MRSA     infection nor to guide or     monitor treatment for     MRSA infections.     Studies: Dg Chest Port 1 View  05/27/2014   CLINICAL DATA:  Fluid  EXAM: PORTABLE CHEST - 1 VIEW  COMPARISON:  05/26/2014  FINDINGS: Small right and moderate left pleural effusion. Patchy interstitial and alveolar airspace opacities. No pneumothorax. Stable cardiomegaly. Thoracic aortic atherosclerosis. Unremarkable osseous structures.  IMPRESSION: Findings consistent with congestive failure without significant interval change.   Electronically Signed   By: Elige KoHetal  Patel   On: 05/27/2014 18:52    Scheduled Meds: . azithromycin  500 mg Intravenous Q24H  . cefTRIAXone (ROCEPHIN)  IV  1 g Intravenous Q24H  . diltiazem  120 mg Oral BID  . furosemide  40 mg Intravenous BID  . metoprolol succinate  25 mg Oral Daily  . warfarin  1.5 mg Oral ONCE-1800  . Warfarin - Pharmacist Dosing Inpatient   Does not apply q1800   Continuous Infusions:  Antibiotics Given (last 72 hours)   Date/Time Action Medication Dose Rate   05/25/14 1450 Given   azithromycin (ZITHROMAX) 500 mg in dextrose 5 % 250 mL IVPB 500 mg 250 mL/hr   05/26/14 1212 Given   cefTRIAXone (ROCEPHIN) 1 g in dextrose 5 % 50 mL IVPB 1 g 100 mL/hr   05/26/14 1324 Given   azithromycin (ZITHROMAX) 500 mg in dextrose 5 % 250 mL IVPB 500 mg 250 mL/hr   05/27/14 1210 Given   cefTRIAXone (ROCEPHIN) 1 g in dextrose 5 % 50 mL IVPB 1 g 100 mL/hr   05/27/14 1458 Given   azithromycin (ZITHROMAX) 500 mg in dextrose 5 % 250 mL IVPB 500 mg 250 mL/hr      Principal Problem:   Acute respiratory failure Active Problems:   Atrial fibrillation   CAP (community acquired pneumonia)   Acute on chronic diastolic heart failure   Palliative care encounter    Time spent: 35 min    VANN, JESSICA  [facility] Pager [phone]. If 7PM-7AM, please contact night-coverage at  [email or web address], password [email or web address] 05/28/2014, 1:41 PM  LOS: 4 days

## 2014-05-28 NOTE — Progress Notes (Signed)
Placed pt On bipap to aid breathing and sats. Pt. Tolerating well at this time.

## 2014-05-28 NOTE — Progress Notes (Signed)
[facility]  Subjective:  -patient is alert and oriented X3  -family gathered at bedside, patient has made decision to move forward with thoracentesis  -continued conversation regarding anticipatory care needs and documentation of Advanced Directives     Objective: Allergies  Allergen Reactions  . Codeine Other (See Comments)    Reaction unknown  . Sulfa Antibiotics Other (See Comments)    Reaction unknown   Scheduled Meds: . azithromycin  500 mg Intravenous Q24H  . cefTRIAXone (ROCEPHIN)  IV  1 g Intravenous Q24H  . diltiazem  120 mg Oral BID  . furosemide  40 mg Intravenous BID  . metoprolol succinate  25 mg Oral Daily  . warfarin  1.5 mg Oral ONCE-1800  . Warfarin - Pharmacist Dosing Inpatient   Does not apply q1800   Continuous Infusions:  PRN Meds:.acetaminophen, albuterol, alum & mag hydroxide-simeth, guaiFENesin-dextromethorphan, morphine injection, nitroGLYCERIN, ondansetron (ZOFRAN) IV, ondansetron, senna-docusate  BP 122/67  Pulse 122  Temp(Src) 97.8 F (36.6 C) (Oral)  Resp 33  Ht 4\' 11"  (1.499 m)  Wt 40 kg (88 lb 2.9 oz)  BMI 17.80 kg/m2  SpO2 93%   PPS:40 %  Pain Score:denies    Intake/Output Summary (Last 24 hours) at 05/28/14 1221 Last data filed at 05/28/14 1022  Gross per 24 hour  Intake    870 ml  Output   1125 ml  Net   -255 ml       Physical Exam: General: frail elderly white female, more alert today and engaged HEENT: moist buccal membranes, no exudate noted  Chest: Diminished in bases, R>L, dyspneic at rest  CVS: tachycardic, irregualr  Abdomen:soft NT +BS  Ext: without edema  Neuro: alert and oriented X3   Labs: CBC    Component Value Date/Time   WBC 12.5* 05/28/2014 0255   RBC 4.08 05/28/2014 0255   HGB 10.7* 05/28/2014 0255   HCT 36.5 05/28/2014 0255   PLT 299 05/28/2014 0255   MCV 89.5 05/28/2014 0255   MCH 26.2 05/28/2014 0255   MCHC 29.3* 05/28/2014 0255   RDW 15.1  05/28/2014 0255   LYMPHSABS 1.2 05/25/2014 0331   MONOABS 1.7* 05/25/2014 0331   EOSABS 0.0 05/25/2014 0331   BASOSABS 0.0 05/25/2014 0331    BMET    Component Value Date/Time   NA 135* 05/28/2014 0255   K 4.3 05/28/2014 0255   CL 86* 05/28/2014 0255   CO2 44* 05/28/2014 0255   GLUCOSE 117* 05/28/2014 0255   BUN 14 05/28/2014 0255   CREATININE 0.40* 05/28/2014 0255   CALCIUM 9.0 05/28/2014 0255   GFRNONAA 86* 05/28/2014 0255   GFRAA >90 05/28/2014 0255    CMP     Component Value Date/Time   NA 135* 05/28/2014 0255   K 4.3 05/28/2014 0255   CL 86* 05/28/2014 0255   CO2 44* 05/28/2014 0255   GLUCOSE 117* 05/28/2014 0255   BUN 14 05/28/2014 0255   CREATININE 0.40* 05/28/2014 0255   CALCIUM 9.0 05/28/2014 0255   PROT 6.7 05/25/2014 0331   ALBUMIN 2.8* 05/25/2014 0331   AST 21 05/25/2014 0331   ALT 24 05/25/2014 0331   ALKPHOS 100 05/25/2014 0331   BILITOT 0.3 05/25/2014 0331   GFRNONAA 86* 05/28/2014 0255   GFRAA >90 05/28/2014 0255      Assessment and Plan: 1. Code Status:   Partial (drugs only)  -Continue with BiPap a needed  2. Symptom Control: Dyspnea/ Weakness:Continue with present medical  interventions, patient and family are hopeful for improvement.  3. Psycho/Social: Continued emotional support 4. Spiritual  Strong faith is comfort to patient at this time Disposition:Dependant on outcomes   Patient Documents Completed or Given: Document Given Completed  Advanced Directives Pkt    MOST yes   DNR    Gone from My Sight    Hard Choices yes     Time In Time Out Total Time Spent with Patient Total Overall Time  1200 1235 35 min 35 min    Greater than 50%  of this time was spent counseling and coordinating care related to the above assessment and plan.  PMT will continue to support holistically  Lorinda CreedMary Larach NP  Palliative Medicine Team Team Phone # [phone] Pager [phone]   1

## 2014-05-29 DIAGNOSIS — R0989 Other specified symptoms and signs involving the circulatory and respiratory systems: Secondary | ICD-10-CM

## 2014-05-29 DIAGNOSIS — R0609 Other forms of dyspnea: Secondary | ICD-10-CM

## 2014-05-29 LAB — CBC
HCT: 37.5 % (ref 36.0–46.0)
HEMOGLOBIN: 11.2 g/dL — AB (ref 12.0–15.0)
MCH: 26.5 pg (ref 26.0–34.0)
MCHC: 29.9 g/dL — AB (ref 30.0–36.0)
MCV: 88.7 fL (ref 78.0–100.0)
Platelets: 314 10*3/uL (ref 150–400)
RBC: 4.23 MIL/uL (ref 3.87–5.11)
RDW: 15 % (ref 11.5–15.5)
WBC: 14 10*3/uL — ABNORMAL HIGH (ref 4.0–10.5)

## 2014-05-29 LAB — BASIC METABOLIC PANEL
BUN: 12 mg/dL (ref 6–23)
CHLORIDE: 81 meq/L — AB (ref 96–112)
CO2: >45 mEq/L (ref 19–32)
CREATININE: 0.31 mg/dL — AB (ref 0.50–1.10)
Calcium: 8.8 mg/dL (ref 8.4–10.5)
Glucose, Bld: 105 mg/dL — ABNORMAL HIGH (ref 70–99)
POTASSIUM: 3.3 meq/L — AB (ref 3.7–5.3)
Sodium: 134 mEq/L — ABNORMAL LOW (ref 137–147)

## 2014-05-29 LAB — GLUCOSE, CAPILLARY
GLUCOSE-CAPILLARY: 129 mg/dL — AB (ref 70–99)
GLUCOSE-CAPILLARY: 126 mg/dL — AB (ref 70–99)
Glucose-Capillary: 110 mg/dL — ABNORMAL HIGH (ref 70–99)
Glucose-Capillary: 141 mg/dL — ABNORMAL HIGH (ref 70–99)

## 2014-05-29 LAB — PROTIME-INR
INR: 3.91 — ABNORMAL HIGH (ref 0.00–1.49)
Prothrombin Time: 36.8 seconds — ABNORMAL HIGH (ref 11.6–15.2)

## 2014-05-29 MED ORDER — ALPRAZOLAM 0.25 MG PO TABS: 0.2500 mg | ORAL_TABLET | Freq: Three times a day (TID) | ORAL | Status: DC | PRN

## 2014-05-29 MED ORDER — FUROSEMIDE 10 MG/ML IJ SOLN
40.0000 mg | Freq: Three times a day (TID) | INTRAMUSCULAR | Status: DC
  Administered 2014-05-29 – 2014-05-31 (×5): 40 mg via INTRAVENOUS
  Filled 2014-05-29 (×6): qty 4

## 2014-05-29 MED ORDER — POTASSIUM CHLORIDE 10 MEQ/100ML IV SOLN
10.0000 meq | INTRAVENOUS | Status: AC
  Administered 2014-05-29 (×3): 10 meq via INTRAVENOUS
  Filled 2014-05-29 (×3): qty 100

## 2014-05-29 MED ORDER — POTASSIUM CHLORIDE 10 MEQ/100ML IV SOLN
10.0000 meq | Freq: Once | INTRAVENOUS | Status: AC
  Administered 2014-05-29: 10 meq via INTRAVENOUS
  Filled 2014-05-29: qty 100

## 2014-05-29 NOTE — Progress Notes (Signed)
ANTICOAGULATION CONSULT NOTE - Follow Up Consult  Pharmacy Consult for Coumadin Indication: atrial fibrillation  Allergies  Allergen Reactions  . Codeine Other (See Comments)    Reaction unknown  . Sulfa Antibiotics Other (See Comments)    Reaction unknown    Patient Measurements: Height: 4\' 11"  (149.9 cm) Weight: 85 lb 9.6 oz (38.828 kg) IBW/kg (Calculated) : 43.2 Heparin Dosing Weight:   Vital Signs: Temp: 97.6 F (36.4 C) (06/17 0736) Temp src: Axillary (06/17 0736) BP: 106/44 mmHg (06/17 0736) Pulse Rate: 99 (06/17 0736)  Labs:  Recent Labs  05/27/14 0425 05/28/14 0255 05/28/14 1837 05/29/14 0400  HGB 11.5* 10.7*  --  11.2*  HCT 38.7 36.5  --  37.5  PLT 341 299  --  314  LABPROT 28.0* 25.8*  --  36.8*  INR 2.73* 2.45*  --  3.91*  CREATININE 0.46* 0.40*  --  0.31*  TROPONINI  --   --  <0.30  --     Estimated Creatinine Clearance: 26.3 ml/min (by C-G formula based on Cr of 0.31).  Assessment: 89yof continuing on Coumadin for Afib. INR (3.91) is now supratherapeutic after restarting Coumadin on 6/15 with PTA regimen (1.5mg  daily - patient did not receive any from 6/12-6/14). Antibiotics may have contributed to INR increase - will plan to hold Coumadin tonight and follow-up AM INR.  - H/H and Plts improving - No significant bleeding reported  Goal of Therapy:  INR 2-3   Plan:  1. No Coumadin today 2. Daily INR 3. Consider changing antibiotics to oral regimen when clinically appropriate  Cleon DewDulaney, Stover Robert [phone] 05/29/2014,8:55 AM

## 2014-05-29 NOTE — Evaluation (Signed)
Physical Therapy Evaluation Patient Details Name: [name] MRN: [id number] DOB: [birth year] Today's Date: 05/29/2014   History of Present Illness  80 y.o. with a h/o chronic A-fib on coumadin, diastolic HF, chronic dyspnea presents to ED with dyspnea and chest pressure. Per MD note, pt. has been weak, poor appetite, occasional loose stool, diaphoresis and subjective fevers at home. Initial CXR 6/12 congestive heart failure with pulmonary edema and bilateral effusions, Bibasilar pneumonia cannot be excluded. Repeat CXR 6/16 No pneumothorax following left thoracentesis.   Clinical Impression  Pt admitted with the above. Pt currently with functional limitations due to the deficits listed below (see PT Problem List). At the time of PT eval pt was able to transfer bed<>BSC with mod assist to void bowels. Activity was limited by increased HR. Pt will benefit from skilled PT to increase their independence and safety with mobility to allow discharge to the venue listed below.      Follow Up Recommendations SNF;Supervision/Assistance - 24 hour    Equipment Recommendations  Rolling walker with 5" wheels    Recommendations for Other Services       Precautions / Restrictions Precautions Precautions: Fall Restrictions Weight Bearing Restrictions: No      Mobility  Bed Mobility Overal bed mobility: Needs Assistance Bed Mobility: Supine to Sit;Sit to Supine     Supine to sit: Mod assist;HOB elevated Sit to supine: Min assist;HOB elevated   General bed mobility comments: VC's for sequencing and technique. Assist for trunk elevation to full sitting positiong and bed pad used to scoot hips to EOB.   Transfers Overall transfer level: Needs assistance Equipment used: 1 person hand held assist Transfers: Sit to/from [employer]Stand;Stand Pivot Transfers Sit to Stand: Mod assist Stand pivot transfers: Min assist;Mod assist       General transfer comment: Pt cued for hand placement on seated  surface for safety. [facility] back to bed. Appeared to fatigue while sitting to void bowels. HR increased into the 150's with transfers. Further mobility was limited.  Ambulation/Gait                Stairs            Wheelchair Mobility    Modified Rankin (Stroke Patients Only)       Balance Overall balance assessment: Needs assistance Sitting-balance support: Feet supported;No upper extremity supported Sitting balance-Leahy Scale: Fair     Standing balance support: Bilateral upper extremity supported Standing balance-Leahy Scale: Poor                               Pertinent Vitals/Pain HR at rest averaging in 130's. [facility] increased to 150's. Further activity was limited and ambulation deferred.    Home Living Family/patient expects to be discharged to:: Private residence Living Arrangements: Alone Available Help at Discharge: Family;Available 24 hours/day Type of Home: House Home Access: Stairs to enter Entrance Stairs-Rails: Right Entrance Stairs-Number of Steps: 5 Home Layout: One level Home Equipment: Cane - single point;Walker - 2 wheels      Prior Function Level of Independence: Independent               Hand Dominance   Dominant Hand: Right    Extremity/Trunk Assessment   Upper Extremity Assessment: Defer to OT evaluation           Lower Extremity Assessment: Generalized weakness  Cervical / Trunk Assessment: Kyphotic;Other exceptions  Communication   Communication: No difficulties  Cognition Arousal/Alertness: Awake/alert Behavior During Therapy: WFL for tasks assessed/performed Overall Cognitive Status: Within Functional Limits for tasks assessed                      General Comments      Exercises        Assessment/Plan    PT Assessment Patient needs continued PT services  PT Diagnosis Difficulty walking;Generalized weakness    PT Problem List Decreased strength;Decreased range of motion;Decreased activity tolerance;Decreased balance;Decreased mobility;Decreased safety awareness;Decreased knowledge of use of DME;Cardiopulmonary status limiting activity  PT Treatment Interventions DME instruction;Gait training;Stair training;Therapeutic activities;Functional mobility training;Therapeutic exercise;Neuromuscular re-education;Patient/family education   PT Goals (Current goals can be found in the Care Plan section) Acute Rehab PT Goals Patient Stated Goal: "I want to go home if it's possible." PT Goal Formulation: With patient/family Time For Goal Achievement: 06/12/14 Potential to Achieve Goals: Good    Frequency Min 2X/week   Barriers to discharge        Co-evaluation               End of Session Equipment Utilized During Treatment: Gait belt Activity Tolerance: Patient limited by fatigue Patient left: in bed;with call bell/phone within reach;with family/visitor present Nurse Communication: Mobility status         Time: [id number] PT Time Calculation (min): 40 min   Charges:   PT Evaluation $Initial PT Evaluation Tier I: 1 Procedure PT Treatments $Therapeutic Activity: 23-37 mins   PT G CodesRuthann Cancer:          [name], [name] 05/29/2014, 3:53 PM  [name], PT, DPT Acute Rehabilitation Services Pager: [phone]

## 2014-05-29 NOTE — Evaluation (Signed)
Occupational Therapy Evaluation Patient Details Name: Nakai H Resor MRN: [id number] DOB: May 28, 1917 Today's Date: 05/29/2014    History of Present Illness 80 y.o. with a h/o chronic A-fib on coumadin, diastolic HF, chronic dyspnea presents to ED with dyspnea and chest pressure. Per MD note, pt. has been weak, poor appetite, occasional loose stool, diaphoresis and subjective fevers at home. Initial CXR 6/12 congestive heart failure with pulmonary edema and bilateral effusions, Bibasilar pneumonia cannot be excluded. Repeat CXR 6/16 No pneumothorax following left thoracentesis.    Clinical Impression   Patient is s/p s/p thoracentesis, surgery resulting in functional limitations due to the deficits listed below (see OT problem list).  Patient will benefit from skilled OT acutely to increase independence and safety with ADLS to allow discharge CIR. PT was independent PTA and is oriented x4.     Follow Up Recommendations  CIR;Supervision/Assistance - 24 hour    Equipment Recommendations  3 in 1 bedside comode    Recommendations for Other Services       Precautions / Restrictions Precautions Precautions: Fall Restrictions Weight Bearing Restrictions: No      Mobility Bed Mobility Overal bed mobility: Needs Assistance Bed Mobility: Supine to Sit;Sit to Supine     Supine to sit: Mod assist;HOB elevated Sit to supine: Min assist;HOB elevated   General bed mobility comments: RR increase to 40  Transfers                      Balance                                            ADL Overall ADL's : Needs assistance/impaired Eating/Feeding: Set up;Sitting   Grooming: Wash/dry face;Brushing hair;Set up;Sitting                                 General ADL Comments: Pt supine eating lunch on arrival. pt declined OOB. pt complete bed mobility and grooming at EOB. pt pleasant and 100% correct in all responses     Vision                      Perception     Praxis      Pertinent Vitals/Pain RR 40     Hand Dominance Right   Extremity/Trunk Assessment Upper Extremity Assessment Upper Extremity Assessment: Defer to OT evaluation   Lower Extremity Assessment Lower Extremity Assessment: Generalized weakness   Cervical / Trunk Assessment Cervical / Trunk Assessment: Kyphotic;Other exceptions Cervical / Trunk Exceptions: Significant kyphosis and forward head posture. Pink spot on thoracic vertebrae where appears skin is starting to break down. Daughter states it was present prior to admission.    Communication Communication Communication: No difficulties   Cognition Arousal/Alertness: Awake/alert Behavior During Therapy: WFL for tasks assessed/performed Overall Cognitive Status: Within Functional Limits for tasks assessed                     General Comments       Exercises       Shoulder Instructions      Home Living Family/patient expects to be discharged to:: Private residence Living Arrangements: Alone Available Help at Discharge: Family;Available 24 hours/day Type of Home: House Home Access: Stairs to enter [employer] CorporationEntrance Stairs-Number of Steps: 5 Entrance Stairs-Rails: Right  Home Layout: One level     Bathroom Shower/Tub: Tub/shower unit Shower/tub characteristics: Door FirefighterBathroom Toilet: Standard     Home Equipment: Cane - single point;Walker - 2 wheels          Prior Functioning/Environment Level of Independence: Independent             OT Diagnosis: Generalized weakness;Altered mental status   OT Problem List: Decreased strength;Decreased activity tolerance;Impaired balance (sitting and/or standing);Decreased safety awareness;Decreased knowledge of use of DME or AE;Decreased knowledge of precautions;Pain;Impaired UE functional use;Cardiopulmonary status limiting activity   OT Treatment/Interventions: Self-care/ADL training;Therapeutic exercise;Therapeutic  activities;Patient/family education;Balance training;DME and/or AE instruction;Energy conservation    OT Goals(Current goals can be found in the care plan section) Acute Rehab OT Goals Patient Stated Goal: none specifically stated OT Goal Formulation: With patient Time For Goal Achievement: 06/12/14 Potential to Achieve Goals: Good  OT Frequency: Min 2X/week   Barriers to D/C:            Co-evaluation              End of Session Nurse Communication: Mobility status;Precautions  Activity Tolerance: Patient limited by fatigue Patient left: in chair;with call bell/phone within reach;with family/visitor present   Time: 1441-1500 OT Time Calculation (min): 19 min Charges:  OT General Charges $OT Visit: 1 Procedure OT Evaluation $Initial OT Evaluation Tier I: 1 Procedure OT Treatments $Self Care/Home Management : 8-22 mins G-Codes:    Harolyn RutherfordJones, Jessica B 05/29/2014, 3:08 PM Pager: [phone]

## 2014-05-29 NOTE — Evaluation (Signed)
Clinical/Bedside Swallow Evaluation Patient Details  Name: Marissa SearingJulia H Buck MRN: [id number] Date of Birth: 01/27/1917  Today's Date: 05/29/2014 Time: 0822-0855 SLP Time Calculation (min): 33 min  Past Medical History:  Past Medical History  Diagnosis Date  . HTN (hypertension)   . Arthritis, hip     , Left  . Insomnia   . Celiac disease   . Headaches, cluster   . Atrial fibrillation   . Complication of anesthesia     difficult waking  . Dysrhythmia     hx of atrial fibrilation  . Shortness of breath   . Anxiety   . Anemia   . Celiac disease    Past Surgical History:  Past Surgical History  Procedure Laterality Date  . Abdominal hysterectomy    . Hip pinning    . Cholecystectomy    . Appendectomy    . Tonsillectomy     HPI:  80 y.o. with a h/o chronic A-fib on coumadin, diastolic HF, chronic dyspnea presents to ED with dyspnea and chest pressure. Per MD note, pt. has been weak, poor appetite, occasional loose stool, diaphoresis and subjective fevers at home. Initial CXR 6/12 congestive heart failure with pulmonary edema and bilateral effusions, Bibasilar pneumonia cannot be excluded.  Repeat CXR 6/16 No pneumothorax following left thoracentesis. Changes suggestive of progressive edema particularly on the right.  MD stated to this SLP family reported globus sensation with food.   Assessment / Plan / Recommendation Clinical Impression  Pt. and family described episodes of regurgitation of food and mucous in addition to globus sensation mid sternum intermittently over past several years.  Oropharygneal function appears intact without indications of compromised airway.  Suspect a primary esophageal impairment.  SLP educated pt. and family on esophageal precautions to decrease risk of possible aspiration of suspected esophageal stasis (sit up minimum 45 min after meals, alternate liquids and solids, eat slowly etc).  Provided education regarding further esophageal assessments if  symptoms worsen (frequently regurgitating food, decreased appetite, weigh loss) and to dicuss with MD.  No further ST warranted.     Aspiration Risk  Mild    Diet Recommendation Regular;Thin liquid   Liquid Administration via: Cup;Straw Medication Administration: Whole meds with liquid Supervision: Patient able to self feed Compensations: Slow rate;Small sips/bites;Follow solids with liquid Postural Changes and/or Swallow Maneuvers: Seated upright 90 degrees;Upright 30-60 min after meal    Other  Recommendations Oral Care Recommendations: Oral care BID   Follow Up Recommendations  None    Frequency and Duration        Pertinent Vitals/Pain WDL         Swallow Study         Oral/Motor/Sensory Function Overall Oral Motor/Sensory Function: Appears within functional limits for tasks assessed   Ice Chips Ice chips: Not tested   Thin Liquid Thin Liquid: Within functional limits    Nectar Thick Nectar Thick Liquid: Not tested   Honey Thick Honey Thick Liquid: Not tested   Puree Puree: Within functional limits   Solid   GO    Solid: Within functional limits       Royce MacadamiaLisa Willis Litaker M.Ed [employer]P Pager [phone]  05/29/2014

## 2014-05-29 NOTE — Progress Notes (Signed)
This RN spk with Dr Clearence PedSchorr, regarding changing timing of 0100 dose of lasix, per DR ok to hold til the AM, she will review with the AM team, DR asked this RN to see if pt is amiable to having a foley placed.  This RN reported off to PM RN.

## 2014-05-29 NOTE — Progress Notes (Signed)
OT Cancellation Note  Patient Details Name: [name] MRN: [id number] DOB: 1917/10/28   Cancelled Treatment:    Reason Eval/Treat Not Completed: Patient's level of consciousness (pt currently fatigued s/p PT session)  Harolyn RutherfordJones, Jessica B Pager: [phone]  05/29/2014, 11:45 AM

## 2014-05-29 NOTE — Progress Notes (Addendum)
PROGRESS NOTE  [name] ZOX:[id number] DOB: May 19, 1917 DOA: 05/24/2014 PCP: Sissy Hoff, MD  [name] is a 80 y.o. female who lives on her own, pays her own bills but does not drive.   With a h/o chronic A fib on coumadin, diastolic HF, chronic dyspnea presents to ED with dyspnea and chest pressure. sats reportedly in the 50's in the field. Now with sats in the 90s on bipap. Has been weak, poor appetite, occasional loose stool, diaphoresis and subjective fevers at home.  Patient finally agreed to thoracentesis on 6/16 with 500 cc of what appears to be exudative fluid.   Patient continues to have CO2 retention and is requiring bipap on and off  Assessment/Plan: Acute respiratory failure secondary to pleural effusions and pneumonia: SDU. bipap restarted overnight- mask does not fit well-per family, patient having breathing troubles at home before admission Large pleural effusion-  thoracentesis 6/17 -will get palliative care consult -PRN morphine -weight decreasing -decrease lasix  Hypokalemia -IV replacement  CO2 retention -wean off O2 to keep sats 90-92% -Bipap as tolerated  supratherapeutic coumadin:  trending down  Atrial fibrillation, chronic: rate controlled   CAP (community acquired pneumonia): continue rocephin, azithro. Blood cultures- NGTD;  urine legionella neg, strep neg   Acute on chronic diastolic heart failure: IV lasix  Echo:  There is a large pleural effusion  Htn: continue metoprolol and diltiazem as blood pressure tolerates  Dysphagia -DYS diet and SLP eval- ? Need for barium swallow  Leukocytosis- abx   Anxiety -PRN xanax  Code Status: partial Family Communication: daughters at bedside Disposition Plan: leave in SDU for now   Consultants:  none  Procedures: Echo: Left ventricle: The cavity size was normal. Wall thickness was increased in a pattern of mild LVH. The estimated ejection fraction was 65%. Wall motion was normal;  there were no regional wall motion abnormalities. - Mitral valve: Mildly calcified annulus. - Left atrium: The atrium was moderately dilated. - Right ventricle: The cavity size was mildly dilated. Systolic function was mildly reduced. - Right atrium: The atrium was mildly dilated. - Tricuspid valve: There was moderate regurgitation. - Impressions: There is a large pleural effusion.  Impressions:  - There is a large pleural effusion    HPI/Subjective: Speaking without troubles Breathing "a little" better    Objective: Filed Vitals:   05/29/14 0736  BP: 106/44  Pulse: 99  Temp: 97.6 F (36.4 C)  Resp: 27    Intake/Output Summary (Last 24 hours) at 05/29/14 0839 Last data filed at 05/29/14 0803  Gross per 24 hour  Intake    600 ml  Output   1075 ml  Net   -475 ml   Filed Weights   05/27/14 0500 05/28/14 0406 05/29/14 0500  Weight: 40.053 kg (88 lb 4.8 oz) 40 kg (88 lb 2.9 oz) 38.828 kg (85 lb 9.6 oz)    Exam:   General:  Answers all my questions, hard of hearing- voice muffled  Cardiovascular: irrr  Respiratory: no wheezing, decreased throughout  Abdomen: +BS, soft  Musculoskeletal: moves all 4  ext , no edema  Data Reviewed: Basic Metabolic Panel:  Recent Labs Lab 05/25/14 0331 05/26/14 0335 05/27/14 0425 05/28/14 0255 05/29/14 0400  NA 138 137 137 135* 134*  K 4.3 4.7 4.8 4.3 3.3*  CL 94* 91* 91* 86* 81*  CO2 35* 36* 43* 44* >45*  GLUCOSE 117* 109* 136* 117* 105*  BUN 27* 27* 19 14 12   CREATININE 0.81 0.59 0.46*  0.40* 0.31*  CALCIUM 9.2 9.2 9.3 9.0 8.8   Liver Function Tests:  Recent Labs Lab 05/25/14 0331 05/28/14 1837  AST 21  --   ALT 24  --   ALKPHOS 100  --   BILITOT 0.3  --   PROT 6.7 6.7  ALBUMIN 2.8*  --    No results found for this basename: LIPASE, AMYLASE,  in the last 168 hours No results found for this basename: AMMONIA,  in the last 168 hours CBC:  Recent Labs Lab 05/24/14 1135 05/25/14 0331 05/26/14 0335  05/27/14 0425 05/28/14 0255 05/29/14 0400  WBC 18.1* 12.9* 14.0* 13.4* 12.5* 14.0*  NEUTROABS 15.7* 10.0*  --   --   --   --   HGB 12.1 10.9* 11.2* 11.5* 10.7* 11.2*  HCT 39.0 36.1 38.0 38.7 36.5 37.5  MCV 88.0 87.4 89.4 90.2 89.5 88.7  PLT 316 282 339 341 299 314   Cardiac Enzymes:  Recent Labs Lab 05/28/14 1837  TROPONINI <0.30   BNP (last 3 results)  Recent Labs  05/24/14 1135  PROBNP 3352.0*   CBG:  Recent Labs Lab 05/27/14 2136 05/28/14 0742 05/28/14 1416 05/28/14 1716 05/29/14 0733  GLUCAP 144* 125* 197* 143* 110*    Recent Results (from the past 240 hour(s))  CULTURE, BLOOD (ROUTINE X 2)     Status: None   Collection Time    05/24/14  1:22 PM      Result Value Ref Range Status   Specimen Description BLOOD RIGHT ANTECUBITAL   Final   Special Requests BOTTLES DRAWN AEROBIC AND ANAEROBIC 5CC   Final   Culture  Setup Time     Final   Value: 05/24/2014 16:47     Performed at [facility]   Culture     Final   Value:        BLOOD CULTURE RECEIVED NO GROWTH TO DATE CULTURE WILL BE HELD FOR 5 DAYS BEFORE ISSUING A FINAL NEGATIVE REPORT     Performed at [facility]   Report Status PENDING   Incomplete  CULTURE, BLOOD (ROUTINE X 2)     Status: None   Collection Time    05/24/14  1:32 PM      Result Value Ref Range Status   Specimen Description BLOOD RIGHT FOREARM   Final   Special Requests BOTTLES DRAWN AEROBIC ONLY 3CC   Final   Culture  Setup Time     Final   Value: 05/24/2014 16:48     Performed at [facility]   Culture     Final   Value:        BLOOD CULTURE RECEIVED NO GROWTH TO DATE CULTURE WILL BE HELD FOR 5 DAYS BEFORE ISSUING A FINAL NEGATIVE REPORT     Performed at [facility]   Report Status PENDING   Incomplete  MRSA PCR SCREENING     Status: None   Collection Time    05/24/14  4:56 PM      Result Value Ref Range Status   MRSA by PCR NEGATIVE  NEGATIVE Final   Comment:            The GeneXpert MRSA Assay  (FDA     approved for NASAL specimens     only), is one component of a     comprehensive MRSA colonization     surveillance program. It is not     intended to diagnose MRSA     infection nor  to guide or     monitor treatment for     MRSA infections.  BODY FLUID CULTURE     Status: None   Collection Time    05/28/14  3:53 PM      Result Value Ref Range Status   Specimen Description THORACENTESIS   Final   Special Requests LEFT PLEURAL EFFUSION   Final   Gram Stain     Final   Value: WBC PRESENT,BOTH PMN AND MONONUCLEAR     NO ORGANISMS SEEN     [facility]   Culture PENDING   Incomplete   Report Status PENDING   Incomplete     Studies: Dg Chest Port 1 View  05/28/2014   CLINICAL DATA:  Status post left thoracentesis  EXAM: PORTABLE CHEST - 1 VIEW  COMPARISON:  05/27/2014  FINDINGS: There has been significant reduction in left-sided pleural effusion. No pneumothorax is noted. Persistent small right-sided effusion is seen. Patchy changes are again noted throughout both lungs but worse on the right than the left. This likely represents some progressive edema when compared with the prior study.  IMPRESSION: No pneumothorax following left thoracentesis.  Changes suggestive of progressive edema particularly on the right.   Electronically Signed   By: Alcide CleverMark  Lukens M.D.   On: 05/28/2014 16:42   Dg Chest Port 1 View  05/27/2014   CLINICAL DATA:  Fluid  EXAM: PORTABLE CHEST - 1 VIEW  COMPARISON:  05/26/2014  FINDINGS: Small right and moderate left pleural effusion. Patchy interstitial and alveolar airspace opacities. No pneumothorax. Stable cardiomegaly. Thoracic aortic atherosclerosis. Unremarkable osseous structures.  IMPRESSION: Findings consistent with congestive failure without significant interval change.   Electronically Signed   By: Elige KoHetal  Patel   On: 05/27/2014 18:52   [place] Thoracentesis Asp Pleural Space W/img Guide  05/28/2014   CLINICAL DATA:  Shortness of breath,  left pleural effusion, request for thoracentesis.  EXAM: ULTRASOUND GUIDED left diagnostic and therapeutic THORACENTESIS  COMPARISON:  None.  PROCEDURE: An ultrasound guided thoracentesis was thoroughly discussed with the patient and questions answered. The benefits, risks, alternatives and complications were also discussed. The patient understands and wishes to proceed with the procedure. Written consent was obtained.  Ultrasound was performed to localize and mark an adequate pocket of fluid in the left chest. The area was then prepped and draped in the normal sterile fashion. 1% Lidocaine was used for local anesthesia. Under ultrasound guidance a 19 gauge Yueh catheter was introduced. Thoracentesis was performed. The catheter was removed and a dressing applied.  Complications:  None.  FINDINGS: A total of approximately 500 ml of yellow/blood tinged fluid was removed. A fluid sample was sent for laboratory analysis.  IMPRESSION: Successful ultrasound guided left thoracentesis yielding 500 ml of pleural fluid.  Read By:  Pattricia BossKoreen Morgan PA-C   Electronically Signed   By: Malachy MoanHeath  McCullough M.D.   On: 05/28/2014 16:03    Scheduled Meds: . azithromycin  500 mg Intravenous Q24H  . cefTRIAXone (ROCEPHIN)  IV  1 g Intravenous Q24H  . diltiazem  120 mg Oral BID  . furosemide  40 mg Intravenous Q8H  . metoprolol succinate  25 mg Oral Daily  . potassium chloride  10 mEq Intravenous Q1 Hr x 4  . Warfarin - Pharmacist Dosing Inpatient   Does not apply q1800   Continuous Infusions:  Antibiotics Given (last 72 hours)   Date/Time Action Medication Dose Rate   05/26/14 1212 Given   cefTRIAXone (ROCEPHIN) 1  g in dextrose 5 % 50 mL IVPB 1 g 100 mL/hr   05/26/14 1324 Given   azithromycin (ZITHROMAX) 500 mg in dextrose 5 % 250 mL IVPB 500 mg 250 mL/hr   05/27/14 1210 Given   cefTRIAXone (ROCEPHIN) 1 g in dextrose 5 % 50 mL IVPB 1 g 100 mL/hr   05/27/14 1458 Given   azithromycin (ZITHROMAX) 500 mg in dextrose 5 %  250 mL IVPB 500 mg 250 mL/hr   05/28/14 1417 Given   cefTRIAXone (ROCEPHIN) 1 g in dextrose 5 % 50 mL IVPB 1 g 100 mL/hr   05/28/14 1656 Given  [pt just came back to floor from radiology and had a thoracentesis therefore late]   azithromycin (ZITHROMAX) 500 mg in dextrose 5 % 250 mL IVPB 500 mg 250 mL/hr      Principal Problem:   Acute respiratory failure Active Problems:   Atrial fibrillation   CAP (community acquired pneumonia)   Acute on chronic diastolic heart failure   Palliative care encounter    Time spent: 35 min    VANN, JESSICA  [facility] Pager [phone]. If 7PM-7AM, please contact night-coverage at [email or web address], password [email or web address] 05/29/2014, 8:39 AM  LOS: 5 days

## 2014-05-30 ENCOUNTER — Encounter: Payer: Self-pay | Admitting: *Deleted

## 2014-05-30 DIAGNOSIS — J918 Pleural effusion in other conditions classified elsewhere: Secondary | ICD-10-CM

## 2014-05-30 DIAGNOSIS — J189 Pneumonia, unspecified organism: Secondary | ICD-10-CM | POA: Diagnosis present

## 2014-05-30 DIAGNOSIS — J90 Pleural effusion, not elsewhere classified: Secondary | ICD-10-CM

## 2014-05-30 LAB — CULTURE, BLOOD (ROUTINE X 2)
CULTURE: NO GROWTH
Culture: NO GROWTH

## 2014-05-30 LAB — BASIC METABOLIC PANEL
BUN: 12 mg/dL (ref 6–23)
CALCIUM: 8.6 mg/dL (ref 8.4–10.5)
CO2: 44 mEq/L (ref 19–32)
CREATININE: 0.39 mg/dL — AB (ref 0.50–1.10)
Chloride: 82 mEq/L — ABNORMAL LOW (ref 96–112)
GFR, EST NON AFRICAN AMERICAN: 87 mL/min — AB (ref >90)
Glucose, Bld: 100 mg/dL — ABNORMAL HIGH (ref 70–99)
Potassium: 4.1 mEq/L (ref 3.7–5.3)
SODIUM: 130 meq/L — AB (ref 137–147)

## 2014-05-30 LAB — GLUCOSE, CAPILLARY
Glucose-Capillary: 101 mg/dL — ABNORMAL HIGH (ref 70–99)
Glucose-Capillary: 142 mg/dL — ABNORMAL HIGH (ref 70–99)
Glucose-Capillary: 95 mg/dL (ref 70–99)
Glucose-Capillary: 109 mg/dL — ABNORMAL HIGH (ref 70–99)

## 2014-05-30 LAB — CBC
HCT: 34.2 % — ABNORMAL LOW (ref 36.0–46.0)
Hemoglobin: 10.4 g/dL — ABNORMAL LOW (ref 12.0–15.0)
MCH: 26.9 pg (ref 26.0–34.0)
MCHC: 30.4 g/dL (ref 30.0–36.0)
MCV: 88.6 fL (ref 78.0–100.0)
Platelets: 274 10*3/uL (ref 150–400)
RBC: 3.86 MIL/uL — AB (ref 3.87–5.11)
RDW: 14.9 % (ref 11.5–15.5)
WBC: 11.7 10*3/uL — ABNORMAL HIGH (ref 4.0–10.5)

## 2014-05-30 LAB — PROTIME-INR
INR: 3.55 — ABNORMAL HIGH (ref 0.00–1.49)
Prothrombin Time: 34.2 seconds — ABNORMAL HIGH (ref 11.6–15.2)

## 2014-05-30 MED ORDER — PIPERACILLIN-TAZOBACTAM 3.375 G IVPB
3.3750 g | Freq: Three times a day (TID) | INTRAVENOUS | Status: DC
  Administered 2014-05-30 – 2014-05-31 (×4): 3.375 g via INTRAVENOUS
  Filled 2014-05-30 (×6): qty 50

## 2014-05-30 MED ORDER — VANCOMYCIN HCL 500 MG IV SOLR
500.0000 mg | INTRAVENOUS | Status: DC
  Administered 2014-05-31: 500 mg via INTRAVENOUS
  Filled 2014-05-30: qty 500

## 2014-05-30 MED ORDER — METOPROLOL TARTRATE 1 MG/ML IV SOLN
5.0000 mg | Freq: Once | INTRAVENOUS | Status: AC
  Administered 2014-05-30: 5 mg via INTRAVENOUS
  Filled 2014-05-30: qty 5

## 2014-05-30 MED ORDER — DIGOXIN 125 MCG PO TABS
0.1250 mg | ORAL_TABLET | Freq: Every day | ORAL | Status: DC
  Administered 2014-05-31: 0.125 mg via ORAL
  Filled 2014-05-30: qty 1

## 2014-05-30 MED ORDER — DIGOXIN 0.25 MG/ML IJ SOLN
0.5000 mg | Freq: Once | INTRAMUSCULAR | Status: AC
  Administered 2014-05-30: 0.5 mg via INTRAVENOUS
  Filled 2014-05-30: qty 2

## 2014-05-30 MED ORDER — VANCOMYCIN HCL IN DEXTROSE 750-5 MG/150ML-% IV SOLN
750.0000 mg | Freq: Once | INTRAVENOUS | Status: AC
  Administered 2014-05-30: 750 mg via INTRAVENOUS
  Filled 2014-05-30: qty 150

## 2014-05-30 NOTE — Progress Notes (Signed)
chart reviewed.  PROGRESS NOTE  Marissa SearingJulia H Buck ZOX:[id number] DOB: [birth year] DOA: 05/24/2014 PCP: Sissy HoffSWAYNE,DAVID W, MD  [name] is a 80 y.o. female who lives on her own, pays her own bills but does not drive.   With a h/o chronic A fib on coumadin, diastolic HF, chronic dyspnea presents to ED with dyspnea and chest pressure. sats reportedly in the 50's in the field. Now with sats in the 90s on bipap. Has been weak, poor appetite, occasional loose stool, diaphoresis and subjective fevers at home.  Patient finally agreed to thoracentesis on 6/16 with 500 cc of what appears to be exudative fluid.   Patient continues to have CO2 retention and is requiring bipap on and off  Assessment/Plan: Acute respiratory failure secondary to pleural effusions and pneumonia, acute on chronic diastolic heart failure: SDU. bipap restarted overnight- mask does not fit well-per family, patient having breathing troubles at home before admission Large pleural effusion-  thoracentesis 6/17-  palliative care following -PRN morphine -weight decreasing Continue lasix Progress very slow, despite 6 days rocephin azithro. Will change to vanc and zosyn.  ST eval shows possible esophageal dysmotility  Hypokalemia resolved  CO2 retention -wean off O2 to keep sats 90-92% -Bipap as tolerated  supratherapeutic coumadin:  INR still >3.  Atrial fibrillation, chronic: rate fast. Add low dose digoxin  CAP (community acquired pneumonia): see above. Blood cultures negative;  urine legionella neg, strep neg   Acute on chronic diastolic heart failure: IV lasix  Echo:  There is a large pleural effusion  Htn: continue metoprolol and diltiazem as blood pressure tolerates  Dysphagia -DYS diet and SLP eval- ? Need for barium swallow  Leukocytosis- abx   Anxiety -PRN xanax  Code Status: meds only Family Communication: daughters at bedside Disposition Plan: leave in SDU for  now   Consultants:  none  Procedures: Echo: Left ventricle: The cavity size was normal. Wall thickness was increased in a pattern of mild LVH. The estimated ejection fraction was 65%. Wall motion was normal; there were no regional wall motion abnormalities. - Mitral valve: Mildly calcified annulus. - Left atrium: The atrium was moderately dilated. - Right ventricle: The cavity size was mildly dilated. Systolic function was mildly reduced. - Right atrium: The atrium was mildly dilated. - Tricuspid valve: There was moderate regurgitation. - Impressions: There is a large pleural effusion.  Impressions:  - There is a large pleural effusion    HPI/Subjective: No complaints  Objective: Filed Vitals:   05/30/14 0800  BP: 114/53  Pulse: 104  Temp: 97.5 F (36.4 C)  Resp: 31    Intake/Output Summary (Last 24 hours) at 05/30/14 0952 Last data filed at 05/30/14 0618  Gross per 24 hour  Intake   1300 ml  Output    425 ml  Net    875 ml   Filed Weights   05/27/14 0500 05/28/14 0406 05/29/14 0500  Weight: 40.053 kg (88 lb 4.8 oz) 40 kg (88 lb 2.9 oz) 38.828 kg (85 lb 9.6 oz)   Tele: HR 120, atrial fib  Exam:   General:  Alert, tachypneic with pursed lip breathing  Cardiovascular: irreg irreg, fast  Respiratory: rales, diminished. No wheeze or rhonchi  Abdomen: +BS, soft  Musculoskeletal: moves all 4  ext , no edema  Data Reviewed: Basic Metabolic Panel:  Recent Labs Lab 05/26/14 0335 05/27/14 0425 05/28/14 0255 05/29/14 0400 05/30/14 0328  NA 137 137 135* 134* 130*  K 4.7 4.8 4.3 3.3* 4.1  CL 91* 91* 86* 81* 82*  CO2 36* 43* 44* >45* 44*  GLUCOSE 109* 136* 117* 105* 100*  BUN 27* 19 14 12 12   CREATININE 0.59 0.46* 0.40* 0.31* 0.39*  CALCIUM 9.2 9.3 9.0 8.8 8.6   Liver Function Tests:  Recent Labs Lab 05/25/14 0331 05/28/14 1837  AST 21  --   ALT 24  --   ALKPHOS 100  --   BILITOT 0.3  --   PROT 6.7 6.7  ALBUMIN 2.8*  --    No results  found for this basename: LIPASE, AMYLASE,  in the last 168 hours No results found for this basename: AMMONIA,  in the last 168 hours CBC:  Recent Labs Lab 05/24/14 1135 05/25/14 0331 05/26/14 0335 05/27/14 0425 05/28/14 0255 05/29/14 0400 05/30/14 0328  WBC 18.1* 12.9* 14.0* 13.4* 12.5* 14.0* 11.7*  NEUTROABS 15.7* 10.0*  --   --   --   --   --   HGB 12.1 10.9* 11.2* 11.5* 10.7* 11.2* 10.4*  HCT 39.0 36.1 38.0 38.7 36.5 37.5 34.2*  MCV 88.0 87.4 89.4 90.2 89.5 88.7 88.6  PLT 316 282 339 341 299 314 274    Cardiac Enzymes:  Recent Labs Lab 05/28/14 1837  TROPONINI <0.30   BNP (last 3 results)  Recent Labs  05/24/14 1135  PROBNP 3352.0*   CBG:  Recent Labs Lab 05/29/14 0733 05/29/14 1222 05/29/14 1602 05/29/14 2138 05/30/14 0728  GLUCAP 110* 129* 141* 126* 101*    Recent Results (from the past 240 hour(s))  CULTURE, BLOOD (ROUTINE X 2)     Status: None   Collection Time    05/24/14  1:22 PM      Result Value Ref Range Status   Specimen Description BLOOD RIGHT ANTECUBITAL   Final   Special Requests BOTTLES DRAWN AEROBIC AND ANAEROBIC 5CC   Final   Culture  Setup Time     Final   Value: 05/24/2014 16:47     Performed at [facility]   Culture     Final   Value:        BLOOD CULTURE RECEIVED NO GROWTH TO DATE CULTURE WILL BE HELD FOR 5 DAYS BEFORE ISSUING A FINAL NEGATIVE REPORT     Performed at [facility]   Report Status PENDING   Incomplete  CULTURE, BLOOD (ROUTINE X 2)     Status: None   Collection Time    05/24/14  1:32 PM      Result Value Ref Range Status   Specimen Description BLOOD RIGHT FOREARM   Final   Special Requests BOTTLES DRAWN AEROBIC ONLY 3CC   Final   Culture  Setup Time     Final   Value: 05/24/2014 16:48     Performed at [facility]   Culture     Final   Value:        BLOOD CULTURE RECEIVED NO GROWTH TO DATE CULTURE WILL BE HELD FOR 5 DAYS BEFORE ISSUING A FINAL NEGATIVE REPORT     Performed at [facility]   Report Status PENDING   Incomplete  MRSA PCR SCREENING     Status: None   Collection Time    05/24/14  4:56 PM      Result Value Ref Range Status   MRSA by PCR NEGATIVE  NEGATIVE Final   Comment:            The GeneXpert MRSA Assay (FDA     approved  for NASAL specimens     only), is one component of a     comprehensive MRSA colonization     surveillance program. It is not     intended to diagnose MRSA     infection nor to guide or     monitor treatment for     MRSA infections.  BODY FLUID CULTURE     Status: None   Collection Time    05/28/14  3:53 PM      Result Value Ref Range Status   Specimen Description THORACENTESIS   Final   Special Requests LEFT PLEURAL EFFUSION   Final   Gram Stain     Final   Value: WBC PRESENT,BOTH PMN AND MONONUCLEAR     NO ORGANISMS SEEN     Performed at [facility]   Culture     Final   Value: NO GROWTH 1 DAY     Performed at [facility]   Report Status PENDING   Incomplete     Studies: Dg Chest Port 1 View  05/28/2014   CLINICAL DATA:  Status post left thoracentesis  EXAM: PORTABLE CHEST - 1 VIEW  COMPARISON:  05/27/2014  FINDINGS: There has been significant reduction in left-sided pleural effusion. No pneumothorax is noted. Persistent small right-sided effusion is seen. Patchy changes are again noted throughout both lungs but worse on the right than the left. This likely represents some progressive edema when compared with the prior study.  IMPRESSION: No pneumothorax following left thoracentesis.  Changes suggestive of progressive edema particularly on the right.   Electronically Signed   By: Alcide Clever M.D.   On: 05/28/2014 16:42   US Thoracentesis Asp Pleural Space W/img Guide  05/28/2014   CLINICAL DATA:  Shortness of breath, left pleural effusion, request for thoracentesis.  EXAM: ULTRASOUND GUIDED left diagnostic and therapeutic THORACENTESIS  COMPARISON:  None.  PROCEDURE: An ultrasound guided  thoracentesis was thoroughly discussed with the patient and questions answered. The benefits, risks, alternatives and complications were also discussed. The patient understands and wishes to proceed with the procedure. Written consent was obtained.  Ultrasound was performed to localize and mark an adequate pocket of fluid in the left chest. The area was then prepped and draped in the normal sterile fashion. 1% Lidocaine was used for local anesthesia. Under ultrasound guidance a 19 gauge Yueh catheter was introduced. Thoracentesis was performed. The catheter was removed and a dressing applied.  Complications:  None.  FINDINGS: A total of approximately 500 ml of yellow/blood tinged fluid was removed. A fluid sample was sent for laboratory analysis.  IMPRESSION: Successful ultrasound guided left thoracentesis yielding 500 ml of pleural fluid.  Read By:  Pattricia Boss PA-C   Electronically Signed   By: Malachy Moan M.D.   On: 05/28/2014 16:03    Scheduled Meds: . azithromycin  500 mg Intravenous Q24H  . cefTRIAXone (ROCEPHIN)  IV  1 g Intravenous Q24H  . diltiazem  120 mg Oral BID  . furosemide  40 mg Intravenous Q8H  . metoprolol succinate  25 mg Oral Daily  . Warfarin - Pharmacist Dosing Inpatient   Does not apply q1800   Continuous Infusions:  Antibiotics Given (last 72 hours)   Date/Time Action Medication Dose Rate   05/27/14 1210 Given   cefTRIAXone (ROCEPHIN) 1 g in dextrose 5 % 50 mL IVPB 1 g 100 mL/hr   05/27/14 1458 Given   azithromycin (ZITHROMAX) 500 mg in dextrose 5 % 250 mL  IVPB 500 mg 250 mL/hr   05/28/14 1417 Given   cefTRIAXone (ROCEPHIN) 1 g in dextrose 5 % 50 mL IVPB 1 g 100 mL/hr   05/28/14 1656 Given  [pt just came back to floor from radiology and had a thoracentesis therefore late]   azithromycin (ZITHROMAX) 500 mg in dextrose 5 % 250 mL IVPB 500 mg 250 mL/hr   05/29/14 1521 Given   cefTRIAXone (ROCEPHIN) 1 g in dextrose 5 % 50 mL IVPB 1 g 100 mL/hr   05/29/14 1854  Given   azithromycin (ZITHROMAX) 500 mg in dextrose 5 % 250 mL IVPB 500 mg 250 mL/hr     Time spent: 35 min  SULLIVAN,CORINNA L  [facility] Pager [phone]. If 7PM-7AM, please contact night-coverage at [email or web address], password [email or web address] 05/30/2014, 9:52 AM  LOS: 6 days

## 2014-05-30 NOTE — Progress Notes (Signed)
  ANTICOAGULATION/ANTIBIOTIC CONSULT NOTE - Follow Up Consult  Pharmacy Consult for Coumadin, Vancomycin Indication: atrial fibrillation; PNA and Pleural Effusion  Allergies  Allergen Reactions  . Codeine Other (See Comments)    Reaction unknown  . Sulfa Antibiotics Other (See Comments)    Reaction unknown    Patient Measurements: Height: 4\' 11"  (149.9 cm) Weight: 85 lb 9.6 oz (38.828 kg) IBW/kg (Calculated) : 43.2 Heparin Dosing Weight:   Vital Signs: Temp: 97.5 F (36.4 C) (06/18 0800) Temp src: Oral (06/18 0800) BP: 114/53 mmHg (06/18 0800) Pulse Rate: 104 (06/18 0800)  Labs:  Recent Labs  05/28/14 0255 05/28/14 1837 05/29/14 0400 05/30/14 0328  HGB 10.7*  --  11.2* 10.4*  HCT 36.5  --  37.5 34.2*  PLT 299  --  314 274  LABPROT 25.8*  --  36.8* 34.2*  INR 2.45*  --  3.91* 3.55*  CREATININE 0.40*  --  0.31* 0.39*  TROPONINI  --  <0.30  --   --     Estimated Creatinine Clearance: 26.3 ml/min (by C-G formula based on Cr of 0.39).  Assessment: 1. 89yof continuing on Coumadin for Afib. INR (3.55) remains supratherapeutic but trended down over night with held dose - will continue to hold Coumadin tonight and allow INR to trend down. - H/H and Plts trending down - No significant bleeding reported  2. Patient has also been receiving Azithromycin and Ceftriaxone for PNA/Pleural effusion. MD is now broadening antibiotics to Vancomycin and Zosyn due to slow progression. Patient is currently afebrile, WBC trending down and no significant culture results. - Wt: 39kg - Crcl 7925ml/min  Goal of Therapy:  INR 2-3 Monitor platelets by anticoagulation protocol: Yes Vancomycin trough: 15-20 mcg/ml   Plan:  1. No Coumadin today 2. Daily INR 3. Vancomycin 750mg  IV x 1, then 500mg  IV q24h 4. Zosyn 3.375g IV q8h - infuse over 4 hours  Cleon DewDulaney, [place] [phone] 05/30/2014,9:57 AM

## 2014-05-30 NOTE — Progress Notes (Signed)
HR sustaining between 120's to low 150's in afib. PO cardizem was given at shift change.  BP 113/69.  Pt resting comfortably on the Bipap.  NP notified.  Metoprolol 5mg  IV given. Will continue to monitor.  M.Foster SimpsonPiel, RN

## 2014-05-31 ENCOUNTER — Inpatient Hospital Stay
Admission: AD | Admit: 2014-05-31 | Discharge: 2014-06-20 | Disposition: A | Discharge status: Skilled Nursing Facility | Payer: Self-pay | Source: Ambulatory Visit | Attending: Internal Medicine | Admitting: Internal Medicine

## 2014-05-31 DIAGNOSIS — E43 Unspecified severe protein-calorie malnutrition: Secondary | ICD-10-CM

## 2014-05-31 LAB — GLUCOSE, CAPILLARY
GLUCOSE-CAPILLARY: 91 mg/dL (ref 70–99)
Glucose-Capillary: 117 mg/dL — ABNORMAL HIGH (ref 70–99)

## 2014-05-31 LAB — CBC
HCT: 34.4 % — ABNORMAL LOW (ref 36.0–46.0)
HEMOGLOBIN: 10.7 g/dL — AB (ref 12.0–15.0)
MCH: 27.2 pg (ref 26.0–34.0)
MCHC: 31.1 g/dL (ref 30.0–36.0)
MCV: 87.3 fL (ref 78.0–100.0)
PLATELETS: 262 10*3/uL (ref 150–400)
RBC: 3.94 MIL/uL (ref 3.87–5.11)
RDW: 14.6 % (ref 11.5–15.5)
WBC: 11 10*3/uL — ABNORMAL HIGH (ref 4.0–10.5)

## 2014-05-31 LAB — BODY FLUID CULTURE: Culture: NO GROWTH

## 2014-05-31 LAB — BASIC METABOLIC PANEL
BUN: 11 mg/dL (ref 6–23)
CO2: >45 mEq/L (ref 19–32)
Calcium: 8.4 mg/dL (ref 8.4–10.5)
Chloride: 83 mEq/L — ABNORMAL LOW (ref 96–112)
Creatinine, Ser: 0.43 mg/dL — ABNORMAL LOW (ref 0.50–1.10)
GFR calc Af Amer: >90 mL/min (ref >90)
GFR calc non Af Amer: 84 mL/min — ABNORMAL LOW (ref >90)
Glucose, Bld: 106 mg/dL — ABNORMAL HIGH (ref 70–99)
POTASSIUM: 4 meq/L (ref 3.7–5.3)
SODIUM: 134 meq/L — AB (ref 137–147)

## 2014-05-31 LAB — PROTIME-INR
INR: 1.62 — AB (ref 0.00–1.49)
Prothrombin Time: 18.8 seconds — ABNORMAL HIGH (ref 11.6–15.2)

## 2014-05-31 MED ORDER — VANCOMYCIN HCL 500 MG IV SOLR: 500.0000 mg | INTRAVENOUS | Status: AC

## 2014-05-31 MED ORDER — ALPRAZOLAM 0.25 MG PO TABS: 0.2500 mg | ORAL_TABLET | Freq: Three times a day (TID) | ORAL | Status: AC | PRN

## 2014-05-31 MED ORDER — ALBUTEROL SULFATE (2.5 MG/3ML) 0.083% IN NEBU: 2.5000 mg | INHALATION_SOLUTION | RESPIRATORY_TRACT | Status: AC | PRN

## 2014-05-31 MED ORDER — WARFARIN SODIUM 1 MG PO TABS: 1.0000 mg | ORAL_TABLET | Freq: Every day | ORAL | Status: AC

## 2014-05-31 MED ORDER — WARFARIN SODIUM 1 MG PO TABS
1.5000 mg | ORAL_TABLET | Freq: Once | ORAL | Status: DC
  Filled 2014-05-31: qty 1

## 2014-05-31 MED ORDER — PIPERACILLIN-TAZOBACTAM 3.375 G IVPB: 3.3750 g | Freq: Three times a day (TID) | INTRAVENOUS | Status: AC

## 2014-05-31 MED ORDER — NITROGLYCERIN 0.4 MG SL SUBL: 0.4000 mg | SUBLINGUAL_TABLET | SUBLINGUAL | Status: AC | PRN

## 2014-05-31 MED ORDER — ONDANSETRON HCL 4 MG/2ML IJ SOLN: 4.0000 mg | Freq: Four times a day (QID) | INTRAMUSCULAR | Status: AC | PRN

## 2014-05-31 MED ORDER — SENNOSIDES-DOCUSATE SODIUM 8.6-50 MG PO TABS: 1.0000 | ORAL_TABLET | Freq: Every evening | ORAL | Status: AC | PRN

## 2014-05-31 MED ORDER — FUROSEMIDE 10 MG/ML IJ SOLN: 40.0000 mg | Freq: Two times a day (BID) | INTRAMUSCULAR | Status: DC

## 2014-05-31 MED ORDER — FUROSEMIDE 10 MG/ML IJ SOLN
40.0000 mg | Freq: Two times a day (BID) | INTRAMUSCULAR | Status: AC
Start: 2014-05-31 — End: ?

## 2014-05-31 MED ORDER — ALUM & MAG HYDROXIDE-SIMETH 200-200-20 MG/5ML PO SUSP: 30.0000 mL | Freq: Four times a day (QID) | ORAL | Status: AC | PRN

## 2014-05-31 MED ORDER — METOPROLOL TARTRATE 25 MG PO TABS: 25.0000 mg | ORAL_TABLET | Freq: Two times a day (BID) | ORAL | Status: AC

## 2014-05-31 MED ORDER — ONDANSETRON HCL 4 MG PO TABS: 4.0000 mg | ORAL_TABLET | Freq: Four times a day (QID) | ORAL | Status: AC | PRN

## 2014-05-31 MED ORDER — DIGOXIN 125 MCG PO TABS: 0.1250 mg | ORAL_TABLET | Freq: Every day | ORAL | Status: AC

## 2014-05-31 MED ORDER — GUAIFENESIN-DM 100-10 MG/5ML PO SYRP: 5.0000 mL | ORAL_SOLUTION | ORAL | Status: AC | PRN

## 2014-05-31 MED ORDER — MORPHINE SULFATE 2 MG/ML IJ SOLN: 1.0000 mg | INTRAMUSCULAR | Status: AC | PRN

## 2014-05-31 NOTE — Discharge Summary (Signed)
Physician Discharge Summary  Marissa SearingJulia H Buck WUJ:[id number] DOB: October 28, 1917 DOA: 05/24/2014  PCP: Marissa HoffSWAYNE,Marissa W, MD  Admit date: 05/24/2014 Discharge date: 05/31/2014  Time spent: greater than 30 minutes  Recommendations for Outpatient Follow-up:  1. See discharge instructions below 2. Patient is being transferred to long-term acute care for continued treatment of her pneumonia, CHF and atrial fibrillation.  Discharge Diagnoses:  Principal Problem:   Acute respiratory failure Active Problems:   Atrial fibrillation   Protein-calorie malnutrition, severe   CAP (community acquired pneumonia)   Acute on chronic diastolic heart failure   Palliative care encounter   Parapneumonic effusion   Discharge Condition: stable  Code status: meds only. No CPR, difibrillation, mechanical ventillation  Filed Weights   05/27/14 0500 05/28/14 0406 05/29/14 0500  Weight: 40.053 kg (88 lb 4.8 oz) 40 kg (88 lb 2.9 oz) 38.828 kg (85 lb 9.6 oz)    History of present illness:  80 y.o. female  With a h/o chronic A fib on coumadin, diastolic HF, chronic dyspnea presents to ED with dyspnea and chest pressure. sats reportedly in the 50's in the field. Now with sats in the 90s on bipap. Has been weak, poor appetite, occasional loose stool, diaphoresis and subjective fevers at home. Lives alone. In ED, found to have respiratory acidosis, CXR showing CHF, possible pneumonia, leukocytosis, proBNP 3000. No CP currently. Feels slightly better on bipap.  Hospital Course:  Admitted to step down unit. Initially started on Rocephin and azithromycin for treatment he acquired pneumonia. Given a dose of Lasix. Soon after admission, blood pressure dropped and antihypertensives and Lasix were held. Fortunately, she did not require pressors.      Acute respiratory failure secondary to community acquired pneumonia and acute on chronic diastolic heart failure: Patient was admitted to step down unit. Remained on BiPAP for  several days, but has been off BiPAP for 72 hours. Still requiring oxygen.  Sepsis: Soon after admission, blood pressure dropped. Antihypertensives and Lasix were held. She did not require pressors fortunately. Initially, she wanted to be meds and intubation only CODE STATUS, no CPR or defibrillation. Blood pressures have stabilized and she's been started back on metoprolol as blood pressure tolerates.    CAP (community acquired pneumonia): Initially on Rocephin and azithromycin. She is very slow to improve, so this was broadened to vancomycin and Zosyn which she has been on for 24 hours. If she continues to improve, can likely stop in a few days. Urine Legionella and strep pneumonia antigen is negative. Blood cultures negative.    Acute on chronic diastolic heart failure: Initially, diuretics were held due to hypotension. as blood pressure improved, Lasix has been resumed. Admission weight was about 92 pounds. Weight today is 86 pounds. Recommend checking a proBNP tomorrow as well as chest x-ray to gauge progress with respect to heart failure, pneumonia and pleural effusion. She is currently on Lasix 40 mg IV twice daily.  Echocardiogram showed normal left ventricular systolic function. Slightly reduced right ventricular systolic function    Parapneumonic effusion:  Repeat chest x-ray showed large left-sided pleural effusion. Pulmonary medicine was consulted as well as interventional radiology. Thoracentesis was recommended. Initially, patient refused. Palliative care was consulted for goals of care. Eventually, patient did consent to thoracentesis which is exudative but no evidence of empyema. 500 cc of blood-tinged fluid was removed by interventional radiology. Cultures remain negative, but final is pending.  Cytology shows no malignant cells.    Atrial fibrillation: Initially supratherapeutic on Coumadin. This has been  managed by pharmacy. Recommend resuming Coumadin at 1 mg a day and watching closely.  Please suggest keep INR between 2.0 and 3.0. Heart rate has been fast, initially in the 150s and 160s. Metoprolol has been titrated as blood pressure will tolerate. Also, digoxin was added. Her rate currently about 110. She remains in atrial fibrillation.    Protein-calorie malnutrition, severe: Dietitian was consulted.    Palliative care encounter:  Palliative care team consulted for goals of care. Patient wants to continue treatment, but keep interventions to a minimum, and is now meds only code status.  Procedures:  Ultrasound guided left thoracentesis  Consultations:  Pulmonary medicine  Interventional radiology  Palliative care team  Discharge Exam: Filed Vitals:   05/31/14 0752  BP: 123/50  Pulse: 100  Temp: 97.6 F (36.4 C)  Resp: 31   Telemetry: Atrial fibrillation rate about 110  General: In chair. Comfortable. Breathing nonlabored. Cardiovascular: Irregularly irregular, slightly rapid Respiratory:  Diminished throughout without wheezes rhonchi or rales Abdomen soft nontender nondistended Extremities no clubbing cyanosis or edema   Discharge Instructions   Diet - low sodium heart healthy    Complete by:  As directed      Discharge instructions    Complete by:  As directed   Check PCXR 6/20. Adjust coumadin for INR between 2.0 and 3.0.  Check pro BNP 6/20.     Walk with assistance    Complete by:  As directed             Medication List    STOP taking these medications       furosemide 40 MG tablet  Commonly known as:  LASIX  Replaced by:  furosemide 10 MG/ML injection     losartan 50 MG tablet  Commonly known as:  COZAAR     metoprolol succinate 100 MG 24 hr tablet  Commonly known as:  TOPROL-XL      TAKE these medications       acetaminophen 500 MG tablet  Commonly known as:  TYLENOL  Take 500 mg by mouth every 6 (six) hours as needed for mild pain.     albuterol (2.5 MG/3ML) 0.083% nebulizer solution  Commonly known as:  PROVENTIL   Take 3 mLs (2.5 mg total) by nebulization every 2 (two) hours as needed for wheezing.     ALPRAZolam 0.25 MG tablet  Commonly known as:  XANAX  Take 1 tablet (0.25 mg total) by mouth 3 (three) times daily as needed for anxiety.     alum & mag hydroxide-simeth 200-200-20 MG/5ML suspension  Commonly known as:  MAALOX/MYLANTA  Take 30 mLs by mouth every 6 (six) hours as needed for indigestion or heartburn (dyspepsia).     digoxin 0.125 MG tablet  Commonly known as:  LANOXIN  Take 1 tablet (0.125 mg total) by mouth daily.     diltiazem 120 MG 24 hr capsule  Commonly known as:  CARDIZEM CD  Take 1 capsule by mouth 2 (two) times daily.     ferrous sulfate 325 (65 FE) MG tablet  Take 325 mg by mouth daily with breakfast.     furosemide 10 MG/ML injection  Commonly known as:  LASIX  Inject 4 mLs (40 mg total) into the vein 2 (two) times daily.     guaiFENesin-dextromethorphan 100-10 MG/5ML syrup  Commonly known as:  ROBITUSSIN DM  Take 5 mLs by mouth every 4 (four) hours as needed for cough.     metoprolol tartrate 25 MG tablet  Commonly known as:  LOPRESSOR  Take 1 tablet (25 mg total) by mouth 2 (two) times daily.     morphine 2 MG/ML injection  Inject 0.5 mLs (1 mg total) into the vein every 2 (two) hours as needed.     multivitamin tablet  Take 1 tablet by mouth daily.     nitroGLYCERIN 0.4 MG SL tablet  Commonly known as:  NITROSTAT  Place 1 tablet (0.4 mg total) under the tongue every 5 (five) minutes as needed for chest pain.     ondansetron 4 MG tablet  Commonly known as:  ZOFRAN  Take 1 tablet (4 mg total) by mouth every 6 (six) hours as needed for nausea.     ondansetron 4 MG/2ML Soln injection  Commonly known as:  ZOFRAN  Inject 2 mLs (4 mg total) into the vein every 6 (six) hours as needed for nausea.     piperacillin-tazobactam 3.375 GM/50ML IVPB  Commonly known as:  ZOSYN  Inject 50 mLs (3.375 g total) into the vein every 8 (eight) hours.      senna-docusate 8.6-50 MG per tablet  Commonly known as:  Senokot-S  Take 1 tablet by mouth at bedtime as needed for mild constipation.     vancomycin 500 mg in sodium chloride 0.9 % 100 mL  Inject 500 mg into the vein daily.     vitamin E 400 UNIT capsule  Take 400 Units by mouth daily.     warfarin 1 MG tablet  Commonly known as:  COUMADIN  Take 1 tablet (1 mg total) by mouth daily at 6 PM.       Allergies  Allergen Reactions  . Codeine Other (See Comments)    Reaction unknown  . Sulfa Antibiotics Other (See Comments)    Reaction unknown     The results of significant diagnostics from this hospitalization (including imaging, microbiology, ancillary and laboratory) are listed below for reference.    Significant Diagnostic Studies: Dg Chest 2 View  05/03/2014   CLINICAL DATA:  Chronic shortness of breath and fatigue 2 years.  EXAM: CHEST  2 VIEW  COMPARISON:  01/11/2014  FINDINGS: Lungs are somewhat hyperexpanded with flattening of the hemidiaphragms and increased AP diameter of the chest. There is chronic stable change in the left base with blunting of the costophrenic angle suggesting pleural parenchymal scarring versus chronic small left effusion. There is no focal consolidation. There is stable biapical pleural thickening. Mild stable cardiomegaly. Calcified plaque is present over the thoracic aorta. There is diffuse decreased bone mineralization. There are multiple stable spinal compression fractures with exaggerated kyphosis of the thoracic spine.  IMPRESSION: No acute cardiopulmonary disease.  COPD. Chronic changes in the left lung base likely chronic small effusion versus pleural parenchymal scarring.  Stable cardiomegaly.  Multiple stable spinal compression fractures.   Electronically Signed   By: Elberta Fortis M.D.   On: 05/03/2014 15:05   Dg Chest Port 1 View  05/28/2014   CLINICAL DATA:  Status post left thoracentesis  EXAM: PORTABLE CHEST - 1 VIEW  COMPARISON:  05/27/2014   FINDINGS: There has been significant reduction in left-sided pleural effusion. No pneumothorax is noted. Persistent small right-sided effusion is seen. Patchy changes are again noted throughout both lungs but worse on the right than the left. This likely represents some progressive edema when compared with the prior study.  IMPRESSION: No pneumothorax following left thoracentesis.  Changes suggestive of progressive edema particularly on the right.   Electronically Signed  By: Alcide Clever M.D.   On: 05/28/2014 16:42   Dg Chest Port 1 View  05/27/2014   CLINICAL DATA:  Fluid  EXAM: PORTABLE CHEST - 1 VIEW  COMPARISON:  05/26/2014  FINDINGS: Small right and moderate left pleural effusion. Patchy interstitial and alveolar airspace opacities. No pneumothorax. Stable cardiomegaly. Thoracic aortic atherosclerosis. Unremarkable osseous structures.  IMPRESSION: Findings consistent with congestive failure without significant interval change.   Electronically Signed   By: Elige Ko   On: 05/27/2014 18:52   Dg Chest Port 1 View  05/26/2014   CLINICAL DATA:  Shortness of breath.  EXAM: PORTABLE CHEST - 1 VIEW  COMPARISON:  05/24/2014.  FINDINGS: Cardiomegaly an pulmonary venous congestion with bilateral pulmonary alveolar infiltrates consistent with congestive heart failure and pulmonary edema. Bilateral effusions are noted. These findings are progressive from prior study.  IMPRESSION: Progressive changes of congestive heart failure and pulmonary edema with bilateral pleural effusions.   Electronically Signed   By: Maisie Fus  Register   On: 05/26/2014 08:58   Dg Chest Port 1 View  05/24/2014   CLINICAL DATA:  Shortness of breath.  EXAM: PORTABLE CHEST - 1 VIEW  COMPARISON:  Chest x-ray 05/03/2014.  FINDINGS: Cardiomegaly with pulmonary vascular prominence and bilateral interstitial prominence with bilateral pleural effusions are present consistent with congestive heart failure. Basilar alveolar infiltrates and/or  atelectasis present. Basilar pneumonia cannot be excluded.  IMPRESSION: 1. Congestive heart failure with pulmonary edema and bilateral effusions . 2. Bibasilar pneumonia cannot be excluded.   Electronically Signed   By: Maisie Fus  Register   On: 05/24/2014 12:29   US Thoracentesis Asp Pleural Space W/img Guide  05/28/2014   CLINICAL DATA:  Shortness of breath, left pleural effusion, request for thoracentesis.  EXAM: ULTRASOUND GUIDED left diagnostic and therapeutic THORACENTESIS  COMPARISON:  None.  PROCEDURE: An ultrasound guided thoracentesis was thoroughly discussed with the patient and questions answered. The benefits, risks, alternatives and complications were also discussed. The patient understands and wishes to proceed with the procedure. Written consent was obtained.  Ultrasound was performed to localize and mark an adequate pocket of fluid in the left chest. The area was then prepped and draped in the normal sterile fashion. 1% Lidocaine was used for local anesthesia. Under ultrasound guidance a 19 gauge Yueh catheter was introduced. Thoracentesis was performed. The catheter was removed and a dressing applied.  Complications:  None.  FINDINGS: A total of approximately 500 ml of yellow/blood tinged fluid was removed. A fluid sample was sent for laboratory analysis.  IMPRESSION: Successful ultrasound guided left thoracentesis yielding 500 ml of pleural fluid.  Read By:  Pattricia Boss PA-C   Electronically Signed   By: Malachy Moan M.D.   On: 05/28/2014 16:03   Echo  Left ventricle: The cavity size was normal. Wall thickness was increased in a pattern of mild LVH. The estimated ejection fraction was 65%. Wall motion was normal; there were no regional wall motion abnormalities. - Mitral valve: Mildly calcified annulus. - Left atrium: The atrium was moderately dilated. - Right ventricle: The cavity size was mildly dilated. Systolic function was mildly reduced. - Right atrium: The atrium was  mildly dilated. - Tricuspid valve: There was moderate regurgitation. - Impressions: There is a large pleural effusion.  Impressions:  - There is a large pleural effusion.   EKG  Atrial fibrillation with rapid ventricular response with premature ventricular or aberrantly conducted complexes Rightward axis   Microbiology: Recent Results (from the past 240 hour(s))  CULTURE,  BLOOD (ROUTINE X 2)     Status: None   Collection Time    05/24/14  1:22 PM      Result Value Ref Range Status   Specimen Description BLOOD RIGHT ANTECUBITAL   Final   Special Requests BOTTLES DRAWN AEROBIC AND ANAEROBIC 5CC   Final   Culture  Setup Time     Final   Value: 05/24/2014 16:47     Performed at [facility]   Culture     Final   Value: NO GROWTH 5 DAYS     Performed at [facility]   Report Status 05/30/2014 FINAL   Final  CULTURE, BLOOD (ROUTINE X 2)     Status: None   Collection Time    05/24/14  1:32 PM      Result Value Ref Range Status   Specimen Description BLOOD RIGHT FOREARM   Final   Special Requests BOTTLES DRAWN AEROBIC ONLY 3CC   Final   Culture  Setup Time     Final   Value: 05/24/2014 16:48     Performed at [facility]   Culture     Final   Value: NO GROWTH 5 DAYS     Performed at [facility]   Report Status 05/30/2014 FINAL   Final  MRSA PCR SCREENING     Status: None   Collection Time    05/24/14  4:56 PM      Result Value Ref Range Status   MRSA by PCR NEGATIVE  NEGATIVE Final   Comment:            The GeneXpert MRSA Assay (FDA     approved for NASAL specimens     only), is one component of a     comprehensive MRSA colonization     surveillance program. It is not     intended to diagnose MRSA     infection nor to guide or     monitor treatment for     MRSA infections.  BODY FLUID CULTURE     Status: None   Collection Time    05/28/14  3:53 PM      Result Value Ref Range Status   Specimen Description THORACENTESIS   Final    Special Requests LEFT PLEURAL EFFUSION   Final   Gram Stain     Final   Value: WBC PRESENT,BOTH PMN AND MONONUCLEAR     NO ORGANISMS SEEN     Performed at [facility]   Culture     Final   Value: NO GROWTH 3 DAYS     Performed at [facility]   Report Status 05/31/2014 FINAL   Final     Labs: Basic Metabolic Panel:  Recent Labs Lab 05/27/14 0425 05/28/14 0255 05/29/14 0400 05/30/14 0328 05/31/14 0545  NA 137 135* 134* 130* 134*  K 4.8 4.3 3.3* 4.1 4.0  CL 91* 86* 81* 82* 83*  CO2 43* 44* >45* 44* >45*  GLUCOSE 136* 117* 105* 100* 106*  BUN 19 14 12 12 11   CREATININE 0.46* 0.40* 0.31* 0.39* 0.43*  CALCIUM 9.3 9.0 8.8 8.6 8.4   Liver Function Tests:  Recent Labs Lab 05/25/14 0331 05/28/14 1837  AST 21  --   ALT 24  --   ALKPHOS 100  --   BILITOT 0.3  --   PROT 6.7 6.7  ALBUMIN 2.8*  --    No results found for this basename: LIPASE, AMYLASE,  in the last 168 hours No results found for this basename: AMMONIA,  in the last 168 hours CBC:  Recent Labs Lab 05/25/14 0331  05/27/14 0425 05/28/14 0255 05/29/14 0400 05/30/14 0328 05/31/14 0545  WBC 12.9*  < > 13.4* 12.5* 14.0* 11.7* 11.0*  NEUTROABS 10.0*  --   --   --   --   --   --   HGB 10.9*  < > 11.5* 10.7* 11.2* 10.4* 10.7*  HCT 36.1  < > 38.7 36.5 37.5 34.2* 34.4*  MCV 87.4  < > 90.2 89.5 88.7 88.6 87.3  PLT 282  < > 341 299 314 274 262  < > = values in this interval not displayed. Cardiac Enzymes:  Recent Labs Lab 05/28/14 1837  TROPONINI <0.30   BNP: BNP (last 3 results)  Recent Labs  05/24/14 1135  PROBNP 3352.0*   CBG:  Recent Labs Lab 05/30/14 0728 05/30/14 1210 05/30/14 1817 05/30/14 2310 05/31/14 0829  GLUCAP 101* 142* 95 109* 91       Signed:  SULLIVAN,CORINNA L  [facility] 05/31/2014, 1:00 PM

## 2014-05-31 NOTE — Progress Notes (Signed)
ANTICOAGULATION CONSULT NOTE - Follow Up Consult  Pharmacy Consult for Coumadin Indication: atrial fibrillation  Allergies  Allergen Reactions  . Codeine Other (See Comments)    Reaction unknown  . Sulfa Antibiotics Other (See Comments)    Reaction unknown    Patient Measurements: Height: 4\' 11"  (149.9 cm) Weight: 85 lb 9.6 oz (38.828 kg) IBW/kg (Calculated) : 43.2 Heparin Dosing Weight:   Vital Signs: Temp: 97.6 F (36.4 C) (06/19 0752) Temp src: Oral (06/19 0752) BP: 123/50 mmHg (06/19 0752) Pulse Rate: 100 (06/19 0752)  Labs:  Recent Labs  05/28/14 1837  05/29/14 0400 05/30/14 0328 05/31/14 0545  HGB  --   < > 11.2* 10.4* 10.7*  HCT  --   --  37.5 34.2* 34.4*  PLT  --   --  314 274 262  LABPROT  --   --  36.8* 34.2* 18.8*  INR  --   --  3.91* 3.55* 1.62*  CREATININE  --   --  0.31* 0.39* 0.43*  TROPONINI <0.30  --   --   --   --   < > = values in this interval not displayed.  Estimated Creatinine Clearance: 26.3 ml/min (by C-G formula based on Cr of 0.43).   Assessment: 89yof continues on Coumadin for Afib. Coumadin has been on hold the last 2 days for supratherapeutic INR (>3.5) but INR is now subtherapeutic after unexpectedly dropping to 1.62. Will plan to restart PTA regimen (1.5mg  daily) but will monitor INR closely has patient has had varied responses to this dose while hospitalized.  - H/H and Plts stable - No significant bleeding reported  Goal of Therapy:  INR 2-3 Monitor platelets by anticoagulation protocol: Yes   Plan:  1. Coumadin 1.5mg  po x 1 today 2. Follow-up AM INR  Cleon DewDulaney, [place] [phone] 05/31/2014,8:38 AM

## 2014-05-31 NOTE — Progress Notes (Signed)
Report called to Cristie Hemesiree, Charity fundraiserN at [facility].

## 2014-05-31 NOTE — Progress Notes (Signed)
Progress Note from the Palliative Medicine Team at [facility]  Subjective:  -patient is alert and oriented X3, dyspneic at rest  -two daughters at bedside, family tell me "it exhaust her getting on and off the bedpan"  -long conversation regarding appropriate rehabilitation within the context of her GOC      Objective: Allergies  Allergen Reactions  . Codeine Other (See Comments)    Reaction unknown  . Sulfa Antibiotics Other (See Comments)    Reaction unknown   Scheduled Meds: . digoxin  0.125 mg Oral Daily  . diltiazem  120 mg Oral BID  . furosemide  40 mg Intravenous Q8H  . metoprolol succinate  25 mg Oral Daily  . piperacillin-tazobactam (ZOSYN)  IV  3.375 g Intravenous Q8H  . vancomycin  500 mg Intravenous Q24H  . warfarin  1.5 mg Oral ONCE-1800  . Warfarin - Pharmacist Dosing Inpatient   Does not apply q1800   Continuous Infusions:  PRN Meds:.acetaminophen, albuterol, ALPRAZolam, alum & mag hydroxide-simeth, guaiFENesin-dextromethorphan, morphine injection, nitroGLYCERIN, ondansetron (ZOFRAN) IV, ondansetron, senna-docusate  BP 123/50  Pulse 100  Temp(Src) 97.6 F (36.4 C) (Oral)  Resp 31  Ht 4\' 11"  (1.499 m)  Wt 38.828 kg (85 lb 9.6 oz)  BMI 17.28 kg/m2  SpO2 100%   PPS:30 %     Intake/Output Summary (Last 24 hours) at 05/31/14 1017 Last data filed at 05/30/14 2215  Gross per 24 hour  Intake    220 ml  Output    475 ml  Net   -255 ml       Physical Exam:  General: frail elderly white female, more alert today and engaged, very weak and deconditioned  HEENT: moist buccal membranes, no exudate noted  Chest: Diminished in bases, R>L, dyspneic at rest and increases with minimal exertion  CVS: tachycardic, irregualr  Abdomen:soft NT +BS  Ext: without edema  Neuro: alert and oriented X3     Labs: CBC    Component Value Date/Time   WBC 11.0* 05/31/2014 0545   RBC 3.94 05/31/2014 0545   HGB 10.7* 05/31/2014 0545   HCT 34.4* 05/31/2014 0545   PLT  262 05/31/2014 0545   MCV 87.3 05/31/2014 0545   MCH 27.2 05/31/2014 0545   MCHC 31.1 05/31/2014 0545   RDW 14.6 05/31/2014 0545   LYMPHSABS 1.2 05/25/2014 0331   MONOABS 1.7* 05/25/2014 0331   EOSABS 0.0 05/25/2014 0331   BASOSABS 0.0 05/25/2014 0331    BMET    Component Value Date/Time   NA 134* 05/31/2014 0545   K 4.0 05/31/2014 0545   CL 83* 05/31/2014 0545   CO2 >45* 05/31/2014 0545   GLUCOSE 106* 05/31/2014 0545   BUN 11 05/31/2014 0545   CREATININE 0.43* 05/31/2014 0545   CALCIUM 8.4 05/31/2014 0545   GFRNONAA 84* 05/31/2014 0545   GFRAA >90 05/31/2014 0545    CMP     Component Value Date/Time   NA 134* 05/31/2014 0545   K 4.0 05/31/2014 0545   CL 83* 05/31/2014 0545   CO2 >45* 05/31/2014 0545   GLUCOSE 106* 05/31/2014 0545   BUN 11 05/31/2014 0545   CREATININE 0.43* 05/31/2014 0545   CALCIUM 8.4 05/31/2014 0545   PROT 6.7 05/28/2014 1837   ALBUMIN 2.8* 05/25/2014 0331   AST 21 05/25/2014 0331   ALT 24 05/25/2014 0331   ALKPHOS 100 05/25/2014 0331   BILITOT 0.3 05/25/2014 0331   GFRNONAA 84* 05/31/2014 0545   GFRAA >90 05/31/2014 0545  Assessment and Plan: 1. Code Status:  Partial (drugs only)  -Continue with BiPap a needed  2.  Symptom Control: Dyspnea/ Weakness:Continue with present medical interventions, patient and family are hopeful for improvement.  2. Psycho/Social:  Continued emotional support offered to patient and family, education regarding transition options 3. Spiritual:  Rushie GoltzFaith is integral to patient at this time 4. Disposition:  Dependant on options  Patient Documents Completed or Given: Document Given Completed  Advanced Directives Pkt    MOST yes   DNR    Gone from [facility]    Hard Choices yes      Lorinda CreedMary Larach NP  Palliative Medicine Team Team Phone # [phone] Pager [phone]   1

## 2014-05-31 NOTE — Progress Notes (Signed)
Physical Therapy Treatment Patient Details Name: Marissa SearingJulia H Hildreth MRN: [id number] DOB: Dec 05, 1917 Today's Date: 05/31/2014    History of Present Illness 80 y.o. with a h/o chronic A-fib on coumadin, diastolic HF, chronic dyspnea presents to ED with dyspnea and chest pressure. Per MD note, pt. has been weak, poor appetite, occasional loose stool, diaphoresis and subjective fevers at home. Initial CXR 6/12 congestive heart failure with pulmonary edema and bilateral effusions, Bibasilar pneumonia cannot be excluded. Repeat CXR 6/16 No pneumothorax following left thoracentesis.     PT Comments    Pt progressing towards physical therapy goals. Was able to perform transfers and take a few steps with min assist. No LOB and minimal unsteadiness noted. Continue to feel that SNF at d/c will be a good option for pt, as she was independent prior to admission. Will continue to follow and progress as able per POC.   Follow Up Recommendations  SNF;Supervision/Assistance - 24 hour     Equipment Recommendations  Rolling walker with 5" wheels    Recommendations for Other Services       Precautions / Restrictions Precautions Precautions: Fall Restrictions Weight Bearing Restrictions: No    Mobility  Bed Mobility Overal bed mobility: Needs Assistance Bed Mobility: Supine to Sit     Supine to sit: Min assist     General bed mobility comments: VC's for sequencing and technique. Assist for trunk elevation to full sitting position.   Transfers Overall transfer level: Needs assistance Equipment used: 1 person hand held assist Transfers: Sit to/from [employer] CorporationStand;Stand Pivot Transfers Sit to Stand: Min assist Stand pivot transfers: Min assist       General transfer comment: VC's for hand placement on seated surface for safety. Pt able to transfer to the bedside commode, and then take a few steps to transfer to the recliner. Assist for management of lines/leads, and pt was cued for safety awareness and  reaching for arm rests of chair.   Ambulation/Gait                 Stairs            Wheelchair Mobility    Modified Rankin (Stroke Patients Only)       Balance Overall balance assessment: Needs assistance Sitting-balance support: Feet supported;No upper extremity supported Sitting balance-Leahy Scale: Fair     Standing balance support: Bilateral upper extremity supported Standing balance-Leahy Scale: Fair                      Cognition Arousal/Alertness: Awake/alert Behavior During Therapy: WFL for tasks assessed/performed Overall Cognitive Status: Within Functional Limits for tasks assessed                      Exercises      General Comments General comments (skin integrity, edema, etc.): Discussion with daughters regarding PT recommendations for d/c, frequency of PT while inpatient, and general expectations of therapy. Family asking to clarify the differences between rehab options.       Pertinent Vitals/Pain HR increased into the 120's-130's with activity. Overall, pt tolerated treatment well.     Home Living                      Prior Function            PT Goals (current goals can now be found in the care plan section) Acute Rehab PT Goals Patient Stated Goal: "I want to go home if  it's possible." PT Goal Formulation: With patient/family Time For Goal Achievement: 06/12/14 Potential to Achieve Goals: Good Progress towards PT goals: Progressing toward goals    Frequency  Min 2X/week    PT Plan Current plan remains appropriate    Co-evaluation             End of Session Equipment Utilized During Treatment: Gait belt;Oxygen Activity Tolerance: Patient tolerated treatment well Patient left: in chair;with chair alarm set;with call bell/phone within reach;with family/visitor present     Time: 1030-1105 PT Time Calculation (min): 35 min  Charges:  $Therapeutic Activity: 23-37 mins                    G  Codes:      Ruthann CancerHamilton, Delmus Warwick 05/31/2014, 12:31 PM  [name], PT, DPT Acute Rehabilitation Services Pager: [phone]

## 2014-06-01 ENCOUNTER — Other Ambulatory Visit (HOSPITAL_COMMUNITY): Payer: Self-pay

## 2014-06-01 LAB — CBC WITH DIFFERENTIAL/PLATELET
BASOS ABS: 0 10*3/uL (ref 0.0–0.1)
Basophils Relative: 0 % (ref 0–1)
EOS ABS: 0.1 10*3/uL (ref 0.0–0.7)
EOS PCT: 1 % (ref 0–5)
HCT: 36.4 % (ref 36.0–46.0)
Hemoglobin: 11 g/dL — ABNORMAL LOW (ref 12.0–15.0)
Lymphocytes Relative: 9 % — ABNORMAL LOW (ref 12–46)
Lymphs Abs: 1.1 10*3/uL (ref 0.7–4.0)
MCH: 26.7 pg (ref 26.0–34.0)
MCHC: 30.2 g/dL (ref 30.0–36.0)
MCV: 88.3 fL (ref 78.0–100.0)
Monocytes Absolute: 1.1 10*3/uL — ABNORMAL HIGH (ref 0.1–1.0)
Monocytes Relative: 9 % (ref 3–12)
NEUTROS PCT: 81 % — AB (ref 43–77)
Neutro Abs: 9.4 10*3/uL — ABNORMAL HIGH (ref 1.7–7.7)
Platelets: 270 10*3/uL (ref 150–400)
RBC: 4.12 MIL/uL (ref 3.87–5.11)
RDW: 14.9 % (ref 11.5–15.5)
WBC: 11.5 10*3/uL — AB (ref 4.0–10.5)

## 2014-06-01 LAB — BLOOD GAS, ARTERIAL
ACID-BASE EXCESS: 23.8 mmol/L — AB (ref 0.0–2.0)
Bicarbonate: 49.8 mEq/L — ABNORMAL HIGH (ref 20.0–24.0)
O2 CONTENT: 6 L/min
O2 Saturation: 98.8 %
PATIENT TEMPERATURE: 97.5
TCO2: 52 mmol/L (ref 0–100)
pCO2 arterial: 69.2 mmHg (ref 35.0–45.0)
pH, Arterial: 7.468 — ABNORMAL HIGH (ref 7.350–7.450)
pO2, Arterial: 83.7 mmHg (ref 80.0–100.0)

## 2014-06-01 LAB — FERRITIN: Ferritin: 99 ng/mL (ref 10–291)

## 2014-06-01 LAB — COMPREHENSIVE METABOLIC PANEL
ALK PHOS: 91 U/L (ref 39–117)
ALT: 23 U/L (ref 0–35)
AST: 26 U/L (ref 0–37)
Albumin: 2.6 g/dL — ABNORMAL LOW (ref 3.5–5.2)
BUN: 13 mg/dL (ref 6–23)
CALCIUM: 8.8 mg/dL (ref 8.4–10.5)
CO2: >45 mEq/L (ref 19–32)
Chloride: 84 mEq/L — ABNORMAL LOW (ref 96–112)
Creatinine, Ser: 0.54 mg/dL (ref 0.50–1.10)
GFR calc Af Amer: >90 mL/min (ref >90)
GFR calc non Af Amer: 78 mL/min — ABNORMAL LOW (ref >90)
Glucose, Bld: 92 mg/dL (ref 70–99)
POTASSIUM: 3.9 meq/L (ref 3.7–5.3)
SODIUM: 137 meq/L (ref 137–147)
TOTAL PROTEIN: 6.7 g/dL (ref 6.0–8.3)
Total Bilirubin: 0.3 mg/dL (ref 0.3–1.2)

## 2014-06-01 LAB — PHOSPHORUS: Phosphorus: 3.3 mg/dL (ref 2.3–4.6)

## 2014-06-01 LAB — PROTIME-INR
INR: 1.49 (ref 0.00–1.49)
PROTHROMBIN TIME: 17.6 s — AB (ref 11.6–15.2)

## 2014-06-01 LAB — HEMOGLOBIN A1C
HEMOGLOBIN A1C: 6.1 % — AB (ref <5.7)
Mean Plasma Glucose: 128 mg/dL — ABNORMAL HIGH (ref <117)

## 2014-06-01 LAB — MAGNESIUM: Magnesium: 2 mg/dL (ref 1.5–2.5)

## 2014-06-01 LAB — FOLATE RBC: RBC FOLATE: 293 ng/mL (ref >280)

## 2014-06-01 LAB — APTT: aPTT: 35 seconds (ref 24–37)

## 2014-06-01 LAB — PREALBUMIN: PREALBUMIN: 12.5 mg/dL — AB (ref 17.0–34.0)

## 2014-06-01 LAB — VITAMIN B12: Vitamin B-12: 401 pg/mL (ref 211–911)

## 2014-06-01 LAB — TSH: TSH: 1.48 u[IU]/mL (ref 0.350–4.500)

## 2014-06-01 LAB — PROCALCITONIN: Procalcitonin: <0.1 ng/mL

## 2014-06-01 LAB — T4, FREE: Free T4: 1.06 ng/dL (ref 0.80–1.80)

## 2014-06-01 LAB — PRO B NATRIURETIC PEPTIDE: PRO B NATRI PEPTIDE: 989.4 pg/mL — AB (ref 0–450)

## 2014-06-02 LAB — CBC WITH DIFFERENTIAL/PLATELET
BASOS ABS: 0 10*3/uL (ref 0.0–0.1)
BASOS PCT: 0 % (ref 0–1)
EOS ABS: 0.1 10*3/uL (ref 0.0–0.7)
Eosinophils Relative: 1 % (ref 0–5)
HEMATOCRIT: 34.1 % — AB (ref 36.0–46.0)
Hemoglobin: 10 g/dL — ABNORMAL LOW (ref 12.0–15.0)
Lymphocytes Relative: 9 % — ABNORMAL LOW (ref 12–46)
Lymphs Abs: 1.1 10*3/uL (ref 0.7–4.0)
MCH: 26.2 pg (ref 26.0–34.0)
MCHC: 29.3 g/dL — AB (ref 30.0–36.0)
MCV: 89.5 fL (ref 78.0–100.0)
Monocytes Absolute: 1 10*3/uL (ref 0.1–1.0)
Monocytes Relative: 8 % (ref 3–12)
Neutro Abs: 9.4 10*3/uL — ABNORMAL HIGH (ref 1.7–7.7)
Neutrophils Relative %: 82 % — ABNORMAL HIGH (ref 43–77)
Platelets: 261 10*3/uL (ref 150–400)
RBC: 3.81 MIL/uL — ABNORMAL LOW (ref 3.87–5.11)
RDW: 15 % (ref 11.5–15.5)
WBC: 11.5 10*3/uL — ABNORMAL HIGH (ref 4.0–10.5)

## 2014-06-02 LAB — BASIC METABOLIC PANEL
BUN: 10 mg/dL (ref 6–23)
CALCIUM: 8.6 mg/dL (ref 8.4–10.5)
CO2: 44 mEq/L (ref 19–32)
CREATININE: 0.48 mg/dL — AB (ref 0.50–1.10)
Chloride: 88 mEq/L — ABNORMAL LOW (ref 96–112)
GFR calc Af Amer: >90 mL/min (ref >90)
GFR, EST NON AFRICAN AMERICAN: 81 mL/min — AB (ref >90)
Glucose, Bld: 97 mg/dL (ref 70–99)
Potassium: 3.5 mEq/L — ABNORMAL LOW (ref 3.7–5.3)
Sodium: 138 mEq/L (ref 137–147)

## 2014-06-02 LAB — MAGNESIUM: MAGNESIUM: 1.8 mg/dL (ref 1.5–2.5)

## 2014-06-02 LAB — PROTIME-INR
INR: 2.21 — ABNORMAL HIGH (ref 0.00–1.49)
Prothrombin Time: 23.8 seconds — ABNORMAL HIGH (ref 11.6–15.2)

## 2014-06-02 LAB — PHOSPHORUS: Phosphorus: 3 mg/dL (ref 2.3–4.6)

## 2014-06-03 LAB — BASIC METABOLIC PANEL
BUN: 9 mg/dL (ref 6–23)
CO2: 43 mEq/L (ref 19–32)
Calcium: 8.8 mg/dL (ref 8.4–10.5)
Chloride: 90 mEq/L — ABNORMAL LOW (ref 96–112)
Creatinine, Ser: 0.48 mg/dL — ABNORMAL LOW (ref 0.50–1.10)
GFR calc Af Amer: >90 mL/min (ref >90)
GFR calc non Af Amer: 81 mL/min — ABNORMAL LOW (ref >90)
Glucose, Bld: 99 mg/dL (ref 70–99)
POTASSIUM: 3.7 meq/L (ref 3.7–5.3)
SODIUM: 138 meq/L (ref 137–147)

## 2014-06-03 LAB — PROTIME-INR
INR: 2.23 — ABNORMAL HIGH (ref 0.00–1.49)
Prothrombin Time: 24 seconds — ABNORMAL HIGH (ref 11.6–15.2)

## 2014-06-03 LAB — VANCOMYCIN, TROUGH: Vancomycin Tr: <5 ug/mL — ABNORMAL LOW (ref 10.0–20.0)

## 2014-06-04 LAB — PROTIME-INR
INR: 1.59 — AB (ref 0.00–1.49)
PROTHROMBIN TIME: 18.5 s — AB (ref 11.6–15.2)

## 2014-06-05 ENCOUNTER — Other Ambulatory Visit (HOSPITAL_COMMUNITY): Payer: Self-pay

## 2014-06-05 LAB — PROTIME-INR
INR: 1.27 (ref 0.00–1.49)
PROTHROMBIN TIME: 15.6 s — AB (ref 11.6–15.2)

## 2014-06-05 LAB — VANCOMYCIN, TROUGH

## 2014-06-06 ENCOUNTER — Other Ambulatory Visit (HOSPITAL_COMMUNITY): Payer: Self-pay

## 2014-06-06 LAB — CBC WITH DIFFERENTIAL/PLATELET
BASOS PCT: 1 % (ref 0–1)
Basophils Absolute: 0 10*3/uL (ref 0.0–0.1)
EOS ABS: 0.1 10*3/uL (ref 0.0–0.7)
Eosinophils Relative: 1 % (ref 0–5)
HCT: 33.5 % — ABNORMAL LOW (ref 36.0–46.0)
HEMOGLOBIN: 9.8 g/dL — AB (ref 12.0–15.0)
Lymphocytes Relative: 13 % (ref 12–46)
Lymphs Abs: 1.1 10*3/uL (ref 0.7–4.0)
MCH: 26.3 pg (ref 26.0–34.0)
MCHC: 29.3 g/dL — AB (ref 30.0–36.0)
MCV: 90.1 fL (ref 78.0–100.0)
Monocytes Absolute: 1 10*3/uL (ref 0.1–1.0)
Monocytes Relative: 12 % (ref 3–12)
NEUTROS PCT: 73 % (ref 43–77)
Neutro Abs: 6.4 10*3/uL (ref 1.7–7.7)
Platelets: 351 10*3/uL (ref 150–400)
RBC: 3.72 MIL/uL — AB (ref 3.87–5.11)
RDW: 15.6 % — ABNORMAL HIGH (ref 11.5–15.5)
WBC: 8.6 10*3/uL (ref 4.0–10.5)

## 2014-06-06 LAB — BASIC METABOLIC PANEL
BUN: 22 mg/dL (ref 6–23)
CO2: 45 mEq/L (ref 19–32)
CREATININE: 0.44 mg/dL — AB (ref 0.50–1.10)
Calcium: 8.9 mg/dL (ref 8.4–10.5)
Chloride: 94 mEq/L — ABNORMAL LOW (ref 96–112)
GFR, EST NON AFRICAN AMERICAN: 84 mL/min — AB (ref >90)
Glucose, Bld: 99 mg/dL (ref 70–99)
POTASSIUM: 4.1 meq/L (ref 3.7–5.3)
Sodium: 144 mEq/L (ref 137–147)

## 2014-06-06 LAB — PROTIME-INR
INR: 1.25 (ref 0.00–1.49)
PROTHROMBIN TIME: 15.7 s — AB (ref 11.6–15.2)

## 2014-06-06 LAB — CLOSTRIDIUM DIFFICILE BY PCR: Toxigenic C. Difficile by PCR: NEGATIVE

## 2014-06-06 LAB — MAGNESIUM: Magnesium: 2.2 mg/dL (ref 1.5–2.5)

## 2014-06-06 LAB — PHOSPHORUS: Phosphorus: 3.1 mg/dL (ref 2.3–4.6)

## 2014-06-07 ENCOUNTER — Other Ambulatory Visit (HOSPITAL_COMMUNITY): Payer: Self-pay

## 2014-06-07 LAB — PROTIME-INR
INR: 1.38 (ref 0.00–1.49)
PROTHROMBIN TIME: 17 s — AB (ref 11.6–15.2)

## 2014-06-08 LAB — PROTIME-INR
INR: 1.62 — ABNORMAL HIGH (ref 0.00–1.49)
PROTHROMBIN TIME: 19.2 s — AB (ref 11.6–15.2)

## 2014-06-09 LAB — PROTIME-INR
INR: 1.86 — ABNORMAL HIGH (ref 0.00–1.49)
PROTHROMBIN TIME: 21.4 s — AB (ref 11.6–15.2)

## 2014-06-10 LAB — COMPREHENSIVE METABOLIC PANEL
ALT: 21 U/L (ref 0–35)
AST: 27 U/L (ref 0–37)
Albumin: 2.7 g/dL — ABNORMAL LOW (ref 3.5–5.2)
Alkaline Phosphatase: 89 U/L (ref 39–117)
BUN: 24 mg/dL — AB (ref 6–23)
CALCIUM: 8.5 mg/dL (ref 8.4–10.5)
CO2: 39 mEq/L — ABNORMAL HIGH (ref 19–32)
Chloride: 96 mEq/L (ref 96–112)
Creatinine, Ser: 0.41 mg/dL — ABNORMAL LOW (ref 0.50–1.10)
GFR calc non Af Amer: 86 mL/min — ABNORMAL LOW (ref >90)
GLUCOSE: 85 mg/dL (ref 70–99)
Potassium: 4.3 mEq/L (ref 3.7–5.3)
Sodium: 142 mEq/L (ref 137–147)
Total Bilirubin: 0.2 mg/dL — ABNORMAL LOW (ref 0.3–1.2)
Total Protein: 6.8 g/dL (ref 6.0–8.3)

## 2014-06-10 LAB — CBC WITH DIFFERENTIAL/PLATELET
Basophils Absolute: 0 10*3/uL (ref 0.0–0.1)
Basophils Relative: 1 % (ref 0–1)
Eosinophils Absolute: 0.1 10*3/uL (ref 0.0–0.7)
Eosinophils Relative: 1 % (ref 0–5)
HEMATOCRIT: 32.9 % — AB (ref 36.0–46.0)
Hemoglobin: 9.7 g/dL — ABNORMAL LOW (ref 12.0–15.0)
LYMPHS ABS: 0.9 10*3/uL (ref 0.7–4.0)
LYMPHS PCT: 16 % (ref 12–46)
MCH: 27 pg (ref 26.0–34.0)
MCHC: 29.5 g/dL — ABNORMAL LOW (ref 30.0–36.0)
MCV: 91.6 fL (ref 78.0–100.0)
Monocytes Absolute: 0.7 10*3/uL (ref 0.1–1.0)
Monocytes Relative: 12 % (ref 3–12)
Neutro Abs: 4 10*3/uL (ref 1.7–7.7)
Neutrophils Relative %: 70 % (ref 43–77)
Platelets: 361 10*3/uL (ref 150–400)
RBC: 3.59 MIL/uL — ABNORMAL LOW (ref 3.87–5.11)
RDW: 16.1 % — ABNORMAL HIGH (ref 11.5–15.5)
WBC: 5.7 10*3/uL (ref 4.0–10.5)

## 2014-06-10 LAB — PROTIME-INR
INR: 1.97 — ABNORMAL HIGH (ref 0.00–1.49)
Prothrombin Time: 22.4 seconds — ABNORMAL HIGH (ref 11.6–15.2)

## 2014-06-10 LAB — PREALBUMIN: Prealbumin: 17.2 mg/dL — ABNORMAL LOW (ref 17.0–34.0)

## 2014-06-11 LAB — PROTIME-INR
INR: 2.31 — AB (ref 0.00–1.49)
Prothrombin Time: 25.4 seconds — ABNORMAL HIGH (ref 11.6–15.2)

## 2014-06-12 ENCOUNTER — Other Ambulatory Visit (HOSPITAL_COMMUNITY): Payer: Self-pay

## 2014-06-12 LAB — PROTIME-INR
INR: 2.26 — ABNORMAL HIGH (ref 0.00–1.49)
Prothrombin Time: 25 seconds — ABNORMAL HIGH (ref 11.6–15.2)

## 2014-06-13 ENCOUNTER — Other Ambulatory Visit (HOSPITAL_COMMUNITY): Payer: Self-pay

## 2014-06-13 LAB — BODY FLUID CELL COUNT WITH DIFFERENTIAL
EOS FL: 0 %
Lymphs, Fluid: 79 %
Monocyte-Macrophage-Serous Fluid: 4 % — ABNORMAL LOW (ref 50–90)
Neutrophil Count, Fluid: 17 % (ref 0–25)
WBC FLUID: 609 uL (ref 0–1000)

## 2014-06-13 LAB — LACTATE DEHYDROGENASE, PLEURAL OR PERITONEAL FLUID: LD FL: 101 U/L — AB (ref 3–23)

## 2014-06-13 LAB — PROTIME-INR
INR: 2.32 — ABNORMAL HIGH (ref 0.00–1.49)
Prothrombin Time: 25.5 seconds — ABNORMAL HIGH (ref 11.6–15.2)

## 2014-06-13 LAB — GRAM STAIN: Special Requests: NORMAL

## 2014-06-14 LAB — CBC
HCT: 31.4 % — ABNORMAL LOW (ref 36.0–46.0)
Hemoglobin: 9.3 g/dL — ABNORMAL LOW (ref 12.0–15.0)
MCH: 26.2 pg (ref 26.0–34.0)
MCHC: 29.6 g/dL — ABNORMAL LOW (ref 30.0–36.0)
MCV: 88.5 fL (ref 78.0–100.0)
PLATELETS: 276 10*3/uL (ref 150–400)
RBC: 3.55 MIL/uL — AB (ref 3.87–5.11)
RDW: 15.4 % (ref 11.5–15.5)
WBC: 7.3 10*3/uL (ref 4.0–10.5)

## 2014-06-14 LAB — BASIC METABOLIC PANEL
ANION GAP: 5 (ref 5–15)
BUN: 24 mg/dL — ABNORMAL HIGH (ref 6–23)
CHLORIDE: 98 meq/L (ref 96–112)
CO2: 37 meq/L — AB (ref 19–32)
Calcium: 8.2 mg/dL — ABNORMAL LOW (ref 8.4–10.5)
Creatinine, Ser: 0.54 mg/dL (ref 0.50–1.10)
GFR calc Af Amer: >90 mL/min (ref >90)
GFR calc non Af Amer: 78 mL/min — ABNORMAL LOW (ref >90)
Glucose, Bld: 86 mg/dL (ref 70–99)
Potassium: 4.5 mEq/L (ref 3.7–5.3)
Sodium: 140 mEq/L (ref 137–147)

## 2014-06-14 LAB — PROTIME-INR
INR: 2.32 — ABNORMAL HIGH (ref 0.00–1.49)
Prothrombin Time: 25.5 seconds — ABNORMAL HIGH (ref 11.6–15.2)

## 2014-06-14 LAB — AMYLASE, PLEURAL FLUID: AMYLASE, PLEURAL FLUID: 64 U/L

## 2014-06-15 LAB — PROTIME-INR
INR: 2.9 — ABNORMAL HIGH (ref 0.00–1.49)
Prothrombin Time: 30.3 seconds — ABNORMAL HIGH (ref 11.6–15.2)

## 2014-06-16 LAB — BODY FLUID CULTURE
Culture: NO GROWTH
Special Requests: NORMAL

## 2014-06-16 LAB — PROTIME-INR
INR: 2.35 — ABNORMAL HIGH (ref 0.00–1.49)
Prothrombin Time: 25.7 seconds — ABNORMAL HIGH (ref 11.6–15.2)

## 2014-06-17 ENCOUNTER — Other Ambulatory Visit (HOSPITAL_COMMUNITY): Payer: Medicare Other

## 2014-06-17 LAB — URINALYSIS, ROUTINE W REFLEX MICROSCOPIC
BILIRUBIN URINE: NEGATIVE
GLUCOSE, UA: NEGATIVE mg/dL
Hgb urine dipstick: NEGATIVE
KETONES UR: NEGATIVE mg/dL
Nitrite: NEGATIVE
PROTEIN: NEGATIVE mg/dL
Specific Gravity, Urine: 1.02 (ref 1.005–1.030)
Urobilinogen, UA: 0.2 mg/dL (ref 0.0–1.0)
pH: 6 (ref 5.0–8.0)

## 2014-06-17 LAB — CBC WITH DIFFERENTIAL/PLATELET
BASOS PCT: 0 % (ref 0–1)
Basophils Absolute: 0 10*3/uL (ref 0.0–0.1)
EOS ABS: 0.2 10*3/uL (ref 0.0–0.7)
Eosinophils Relative: 2 % (ref 0–5)
HEMATOCRIT: 29.4 % — AB (ref 36.0–46.0)
HEMOGLOBIN: 8.8 g/dL — AB (ref 12.0–15.0)
Lymphocytes Relative: 16 % (ref 12–46)
Lymphs Abs: 1.1 10*3/uL (ref 0.7–4.0)
MCH: 26.5 pg (ref 26.0–34.0)
MCHC: 29.9 g/dL — AB (ref 30.0–36.0)
MCV: 88.6 fL (ref 78.0–100.0)
MONO ABS: 0.9 10*3/uL (ref 0.1–1.0)
MONOS PCT: 13 % — AB (ref 3–12)
Neutro Abs: 4.6 10*3/uL (ref 1.7–7.7)
Neutrophils Relative %: 69 % (ref 43–77)
Platelets: 263 10*3/uL (ref 150–400)
RBC: 3.32 MIL/uL — ABNORMAL LOW (ref 3.87–5.11)
RDW: 15.4 % (ref 11.5–15.5)
WBC: 6.7 10*3/uL (ref 4.0–10.5)

## 2014-06-17 LAB — COMPREHENSIVE METABOLIC PANEL
ALBUMIN: 2.6 g/dL — AB (ref 3.5–5.2)
ALT: 69 U/L — ABNORMAL HIGH (ref 0–35)
ANION GAP: 8 (ref 5–15)
AST: 42 U/L — ABNORMAL HIGH (ref 0–37)
Alkaline Phosphatase: 182 U/L — ABNORMAL HIGH (ref 39–117)
BUN: 23 mg/dL (ref 6–23)
CO2: 34 mEq/L — ABNORMAL HIGH (ref 19–32)
Calcium: 8.2 mg/dL — ABNORMAL LOW (ref 8.4–10.5)
Chloride: 99 mEq/L (ref 96–112)
Creatinine, Ser: 0.45 mg/dL — ABNORMAL LOW (ref 0.50–1.10)
GFR calc Af Amer: >90 mL/min (ref >90)
GFR calc non Af Amer: 83 mL/min — ABNORMAL LOW (ref >90)
Glucose, Bld: 92 mg/dL (ref 70–99)
Potassium: 4.2 mEq/L (ref 3.7–5.3)
Sodium: 141 mEq/L (ref 137–147)
TOTAL PROTEIN: 6.5 g/dL (ref 6.0–8.3)
Total Bilirubin: 0.2 mg/dL — ABNORMAL LOW (ref 0.3–1.2)

## 2014-06-17 LAB — PREALBUMIN: Prealbumin: 13.8 mg/dL — ABNORMAL LOW (ref 17.0–34.0)

## 2014-06-17 LAB — MAGNESIUM: MAGNESIUM: 2 mg/dL (ref 1.5–2.5)

## 2014-06-17 LAB — PROTIME-INR
INR: 2.02 — ABNORMAL HIGH (ref 0.00–1.49)
Prothrombin Time: 22.9 seconds — ABNORMAL HIGH (ref 11.6–15.2)

## 2014-06-17 LAB — URINE MICROSCOPIC-ADD ON

## 2014-06-17 LAB — PHOSPHORUS: Phosphorus: 3.1 mg/dL (ref 2.3–4.6)

## 2014-06-17 LAB — PROTEIN C, TOTAL: PROTEIN C, TOTAL: 39 % — AB (ref 72–160)

## 2014-06-18 LAB — PROTIME-INR
INR: 1.91 — AB (ref 0.00–1.49)
Prothrombin Time: 21.9 seconds — ABNORMAL HIGH (ref 11.6–15.2)

## 2014-06-18 LAB — URINE CULTURE
Colony Count: NO GROWTH
Culture: NO GROWTH

## 2014-06-19 LAB — PROTIME-INR
INR: 2.09 — ABNORMAL HIGH (ref 0.00–1.49)
Prothrombin Time: 23.5 seconds — ABNORMAL HIGH (ref 11.6–15.2)

## 2014-06-20 LAB — PROTIME-INR
INR: 2.12 — AB (ref 0.00–1.49)
Prothrombin Time: 23.7 seconds — ABNORMAL HIGH (ref 11.6–15.2)

## 2015-07-30 NOTE — Patient Outreach (Signed)
[facility]) Care Management  07/30/2015  [name] [birth year] [id number]   Assignment per High Risk List to Marissa Schultz, RN.  Thanks, [name]. [name] [facility] Care Management [facility] CM Assistant Phone: [phone] Fax: [phone]

## 2015-08-11 ENCOUNTER — Other Ambulatory Visit: Payer: Self-pay

## 2015-08-11 NOTE — Patient Outreach (Signed)
[facility]) Care Management  08/11/2015  [name] November 03, 1917 [id number]   Telephonic Care Management (Screening) Note  Outreach call #1.  Patient not reached at [phone] (invalid #).  RN CM rescheduled for next outreach within one week.   Donato Schultz, RN, BSN, [facility], CCM  [facility] Management Coordinator [phone] Direct [phone] Cell [phone] Office [phone] Fax

## 2015-08-13 ENCOUNTER — Ambulatory Visit: Payer: Self-pay

## 2015-08-14 ENCOUNTER — Other Ambulatory Visit: Payer: Self-pay

## 2015-08-14 NOTE — Patient Outreach (Addendum)
[facility]) Care Management  08/14/2015  [name] [birth year] [id number]   Telephonic Care Management (Screening) Note  Outreach call #2. Patient not reached at [phone] (invalid #).   RN CM contacted PCP:  Dr. [name]:  Contact gives other # for Daughter/[name] [phone].   RN CM contacted daughter/[name] who provided update that patient resides in Assisted Living Facility:  [facility] in [place].  Patient is no longer under the care of Dr. Azucena Cecil due to required to receive care under Dr. Andrey Campanile or Dr. Yves Dill while in the facilities care.    Plan: RN CM notified [facility] Administrative Assistant - Case closed - patient assessed; no further intervention needed.   Donato Schultz, RN, BSN, [facility], CCM  [facility] [employer] [facility] Coordinator [phone] Direct [phone] Cell [phone] Office [phone] Fax

## 2015-08-20 NOTE — Patient Outreach (Signed)
[facility]) Care Management  08/20/2015  [name] November 30, 1917 [id number]   Notification from Donato Schultz, RN to close case due to patient assessed and not further interventions needed for [facility] Care Management.  Thanks, Corrie Mckusick. Sharlee Blew [facility] Care Management [facility] CM Assistant Phone: [phone] Fax: [phone]

## 2016-06-14 IMAGING — CR DG CHEST 1V PORT
1 series · 1 of 1 positions shown · non-contrast
Comparison: 05/27/2014

CLINICAL DATA: Status post left thoracentesis

EXAM:
PORTABLE CHEST - 1 VIEW

[AP]
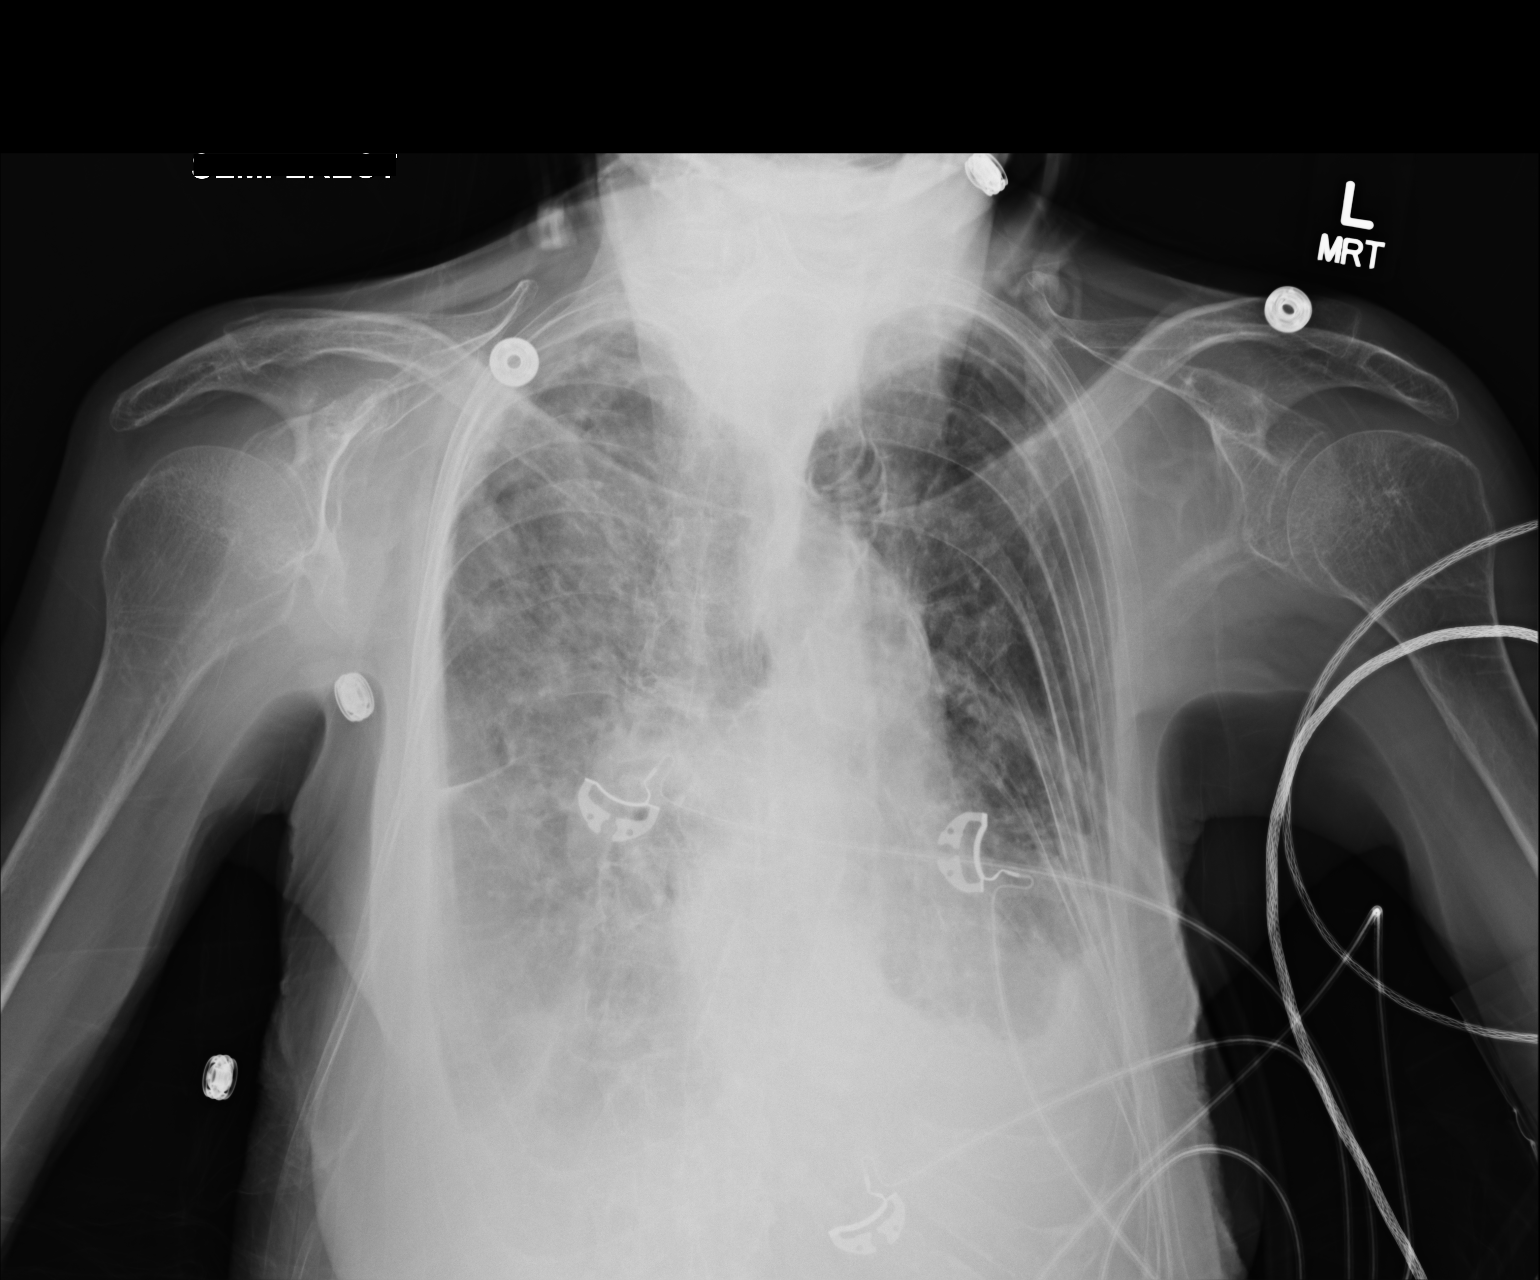

[1 of 1 positions shown; findings below may reference images not displayed]

FINDINGS: There has been significant reduction in left-sided pleural effusion.
No pneumothorax is noted. Persistent small right-sided effusion is
seen. Patchy changes are again noted throughout both lungs but worse
on the right than the left. This likely represents some progressive
edema when compared with the prior study.
IMPRESSION: No pneumothorax following left thoracentesis.

Changes suggestive of progressive edema particularly on the right.

## 2016-06-14 IMAGING — US US THORACENTESIS ASP PLEURAL SPACE W/IMG GUIDE
1 series · 3 of 3 positions shown · non-contrast
Comparison: None.

CLINICAL DATA: Shortness of breath, left pleural effusion, request
for thoracentesis.

EXAM:
ULTRASOUND GUIDED left diagnostic and therapeutic THORACENTESIS

[Series 1: us thoracentesis asp pleural space w/img guide · 0.21mm/px · 3 of 3 slices shown]
[im 1/3]
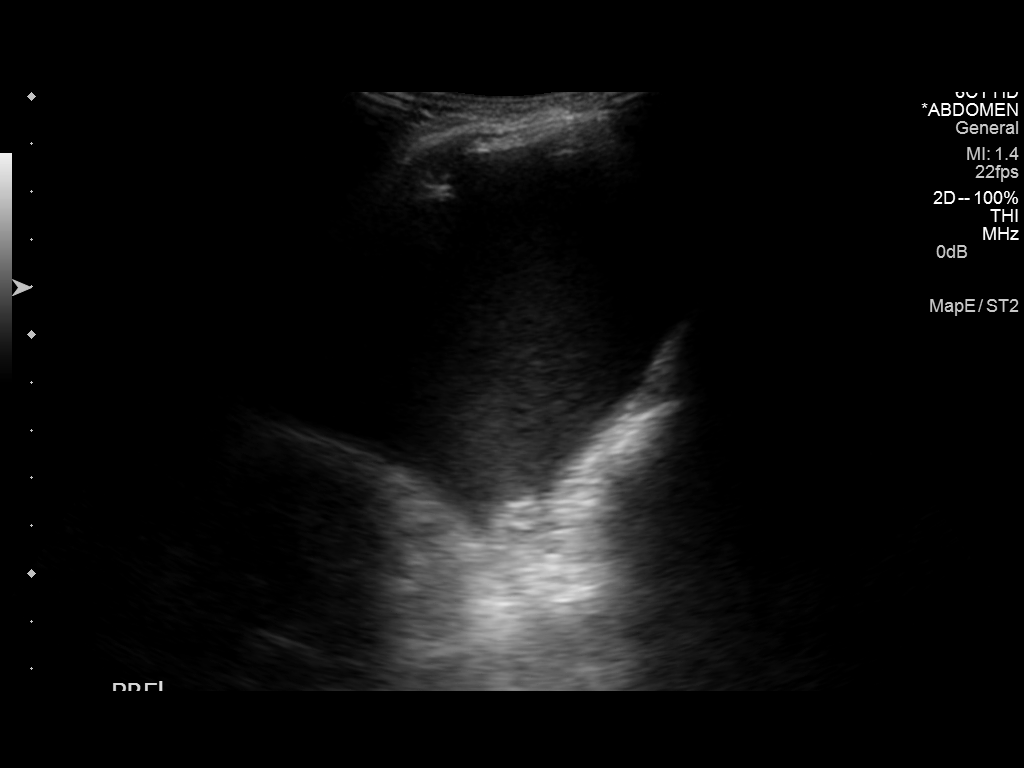
[im 2/3]
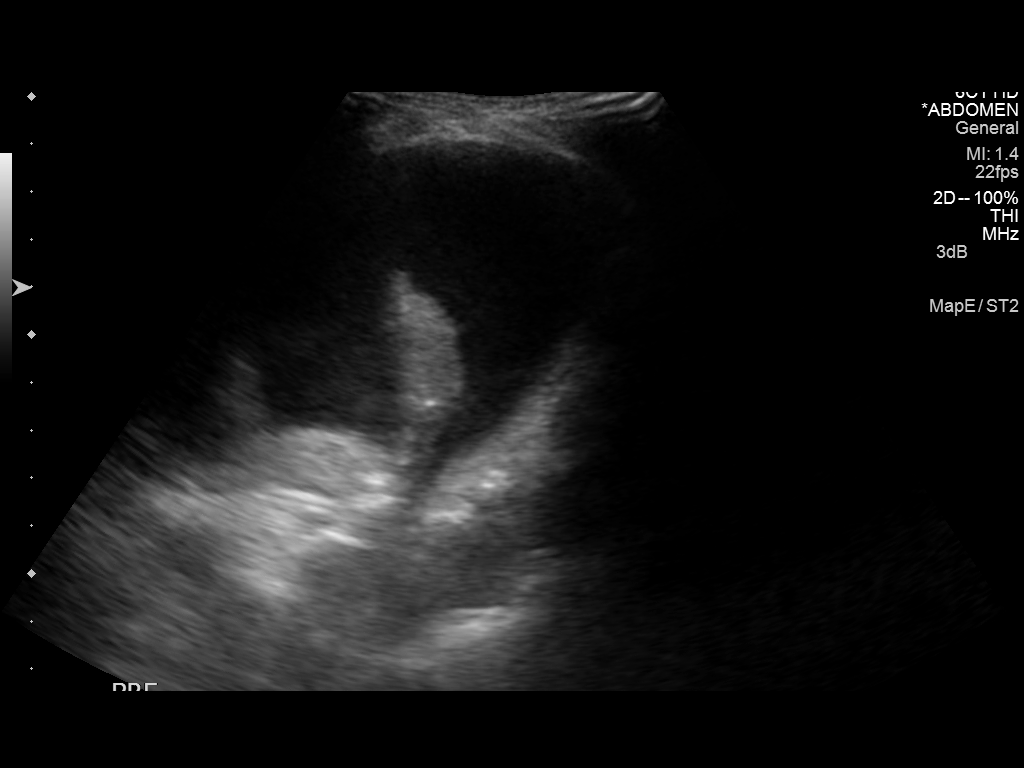
[im 3/3]
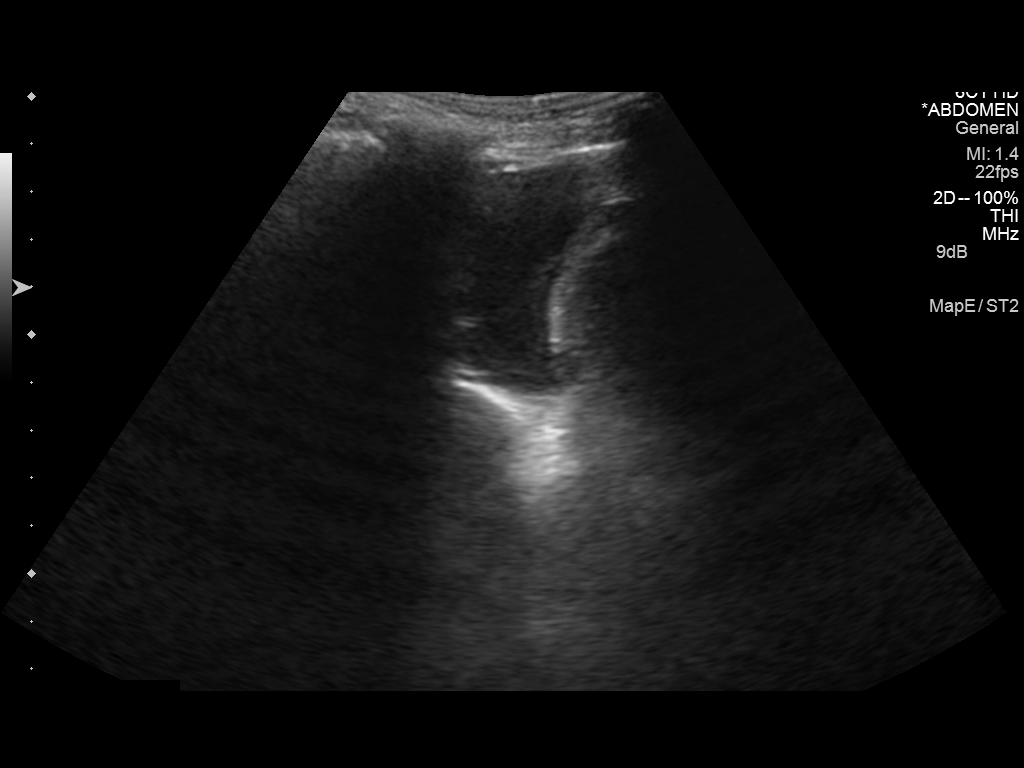

[3 of 3 positions shown; findings below may reference images not displayed]

PROCEDURE:
An ultrasound guided thoracentesis was thoroughly discussed with the
patient and questions answered. The benefits, risks, alternatives
and complications were also discussed. The patient understands and
wishes to proceed with the procedure. Written consent was obtained.

Ultrasound was performed to localize and mark an adequate pocket of
fluid in the left chest. The area was then prepped and draped in the
normal sterile fashion. 1% Lidocaine was used for local anesthesia.
Under ultrasound guidance a 19 gauge Yueh catheter was introduced.
Thoracentesis was performed. The catheter was removed and a dressing
applied.

Complications:  None.
FINDINGS: A total of approximately 500 ml of yellow/blood tinged fluid was
removed. A fluid sample was sent for laboratory analysis.
IMPRESSION: Successful ultrasound guided left thoracentesis yielding 500 ml of
pleural fluid.

## 2016-06-18 IMAGING — CR DG CHEST 1V PORT
1 series · 1 of 1 positions shown · non-contrast
Comparison: 05/28/2014

CLINICAL DATA: Respiratory failure, history of hypertension and
atrial fibrillation

EXAM:
PORTABLE CHEST - 1 VIEW

[AP]
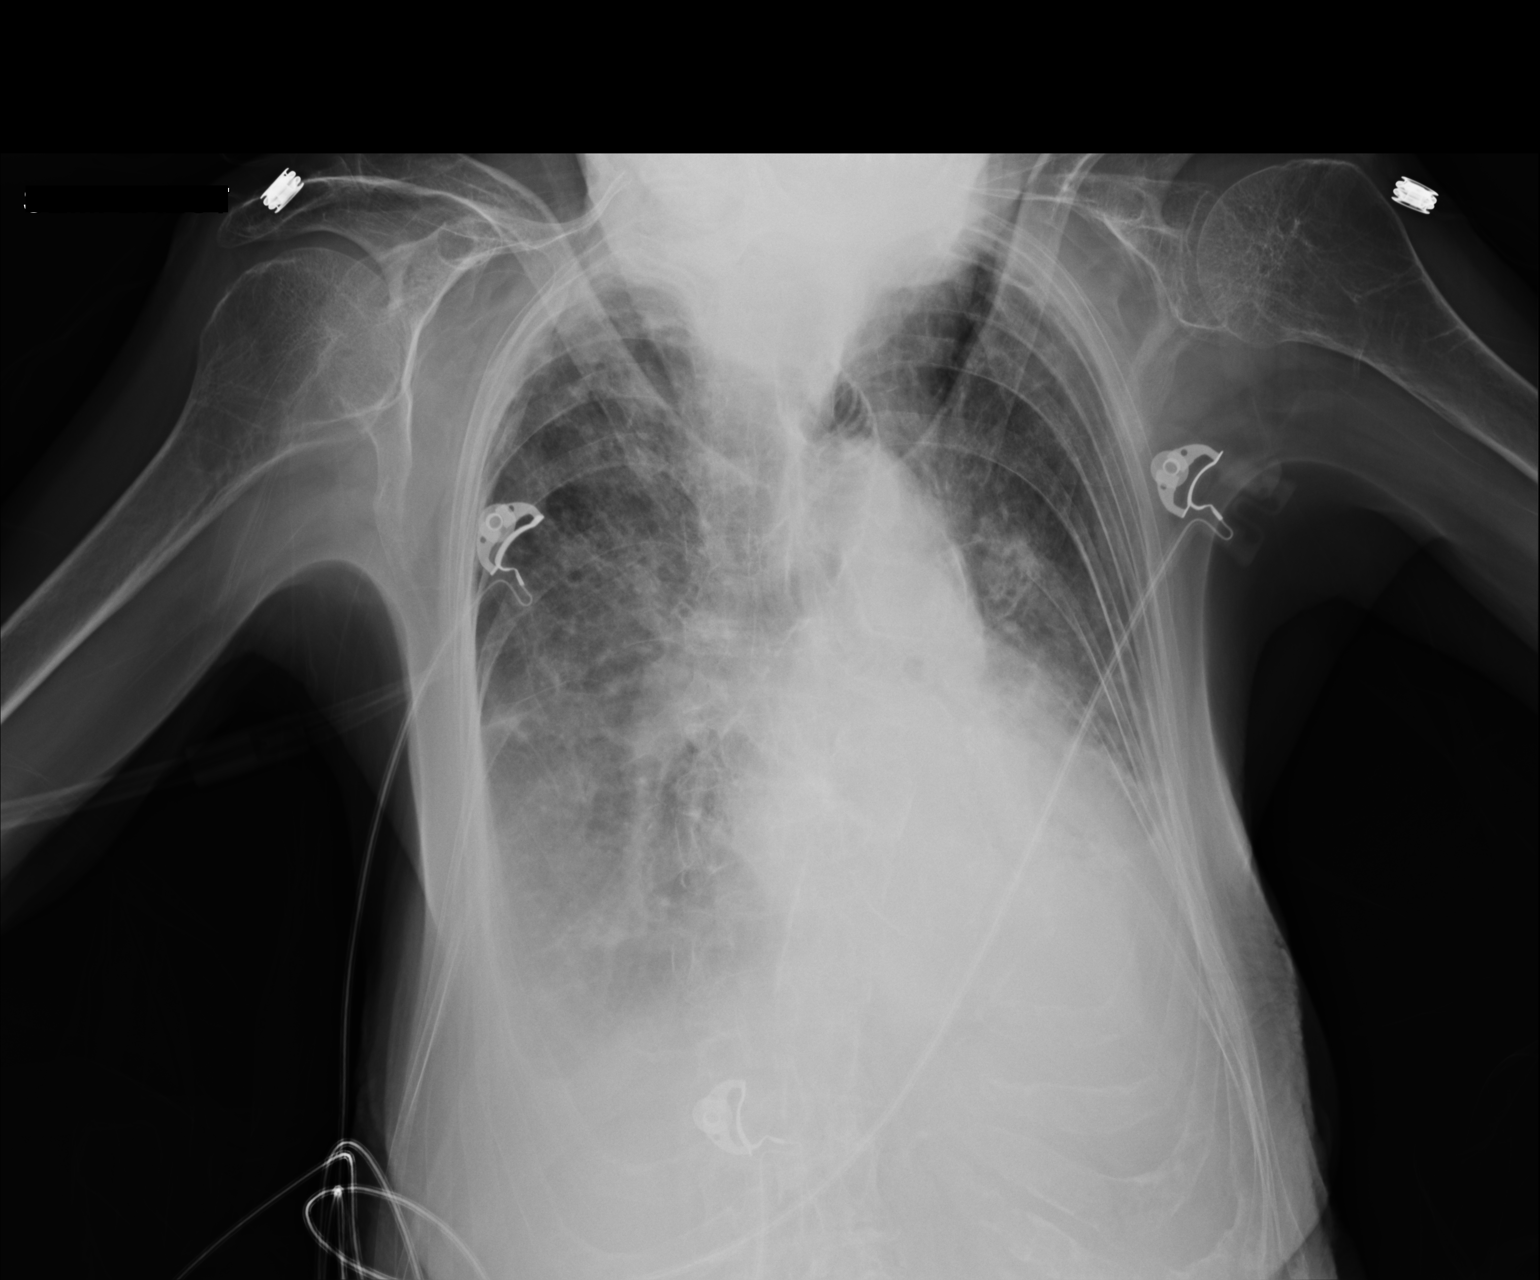

[1 of 1 positions shown; findings below may reference images not displayed]

FINDINGS: Bilateral interstitial change consistent with edema. Evidence of
moderate right pleural effusion not significantly different from
prior study. Opacity left lung base suggests increased effusion with
underlying consolidation. Stable mild cardiac enlargement with
aortic calcification.
IMPRESSION: Findings again consistent with congestive heart failure with
interstitial pulmonary edema. Increased opacity left lower lobe
suggests reaccumulation of pleural effusion with underlying
consolidation likely atelectasis.

## 2017-04-12 DEATH — deceased
